# Patient Record
Sex: Female | Born: 1990 | Race: Black or African American | Hispanic: No | Marital: Single | Smoking: Never smoker
Health system: Southern US, Community
[De-identification: ages and names within clinical notes are randomized; demographics above are authoritative.]

---
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
## 2018-02-09 ENCOUNTER — Encounter (HOSPITAL_COMMUNITY): Payer: Self-pay

## 2018-02-09 ENCOUNTER — Emergency Department (HOSPITAL_COMMUNITY)
Admission: EM | Admit: 2018-02-09 | Discharge: 2018-02-10 | Disposition: A | Discharge status: Home / Self Care | Payer: Self-pay | Attending: Emergency Medicine | Admitting: Emergency Medicine

## 2018-02-09 ENCOUNTER — Other Ambulatory Visit: Payer: Self-pay

## 2018-02-09 DIAGNOSIS — R739 Hyperglycemia, unspecified: Secondary | ICD-10-CM

## 2018-02-09 DIAGNOSIS — B372 Candidiasis of skin and nail: Secondary | ICD-10-CM | POA: Insufficient documentation

## 2018-02-09 DIAGNOSIS — E1165 Type 2 diabetes mellitus with hyperglycemia: Secondary | ICD-10-CM | POA: Insufficient documentation

## 2018-02-09 LAB — CBC
HCT: 34.1 % — ABNORMAL LOW (ref 36.0–46.0)
Hemoglobin: 11.1 g/dL — ABNORMAL LOW (ref 12.0–15.0)
MCH: 28 pg (ref 26.0–34.0)
MCHC: 32.6 g/dL (ref 30.0–36.0)
MCV: 85.9 fL (ref 78.0–100.0)
PLATELETS: 421 10*3/uL — AB (ref 150–400)
RBC: 3.97 MIL/uL (ref 3.87–5.11)
RDW: 14 % (ref 11.5–15.5)
WBC: 11 10*3/uL — AB (ref 4.0–10.5)

## 2018-02-09 LAB — URINALYSIS, ROUTINE W REFLEX MICROSCOPIC
BILIRUBIN URINE: NEGATIVE
Bacteria, UA: NONE SEEN
KETONES UR: NEGATIVE mg/dL
NITRITE: NEGATIVE
PH: 6 (ref 5.0–8.0)
Protein, ur: NEGATIVE mg/dL
Specific Gravity, Urine: 1.03 (ref 1.005–1.030)

## 2018-02-09 LAB — BASIC METABOLIC PANEL
Anion gap: 15 (ref 5–15)
BUN: <5 mg/dL — ABNORMAL LOW (ref 6–20)
CHLORIDE: 97 mmol/L — AB (ref 101–111)
CO2: 19 mmol/L — ABNORMAL LOW (ref 22–32)
CREATININE: 0.74 mg/dL (ref 0.44–1.00)
Calcium: 8.7 mg/dL — ABNORMAL LOW (ref 8.9–10.3)
GFR calc non Af Amer: >60 mL/min (ref >60)
Glucose, Bld: 590 mg/dL (ref 65–99)
POTASSIUM: 3.6 mmol/L (ref 3.5–5.1)
SODIUM: 131 mmol/L — AB (ref 135–145)

## 2018-02-09 LAB — I-STAT BETA HCG BLOOD, ED (MC, WL, AP ONLY): I-stat hCG, quantitative: <5 m[IU]/mL (ref <5)

## 2018-02-09 LAB — CBG MONITORING, ED: Glucose-Capillary: 566 mg/dL (ref 65–99)

## 2018-02-09 NOTE — ED Triage Notes (Addendum)
Pt endorses excessive thirst and urinary frequency and hyperglycemia x 2 weeks. Pt has been off of DM medications x 2 years. VSS. Denies abd pain n/v. CBG 566

## 2018-02-10 LAB — CBG MONITORING, ED
GLUCOSE-CAPILLARY: 411 mg/dL — AB (ref 65–99)
GLUCOSE-CAPILLARY: 516 mg/dL — AB (ref 65–99)
Glucose-Capillary: 280 mg/dL — ABNORMAL HIGH (ref 65–99)

## 2018-02-10 MED ORDER — INSULIN ASPART 100 UNIT/ML ~~LOC~~ SOLN
5.0000 [IU] | Freq: Once | SUBCUTANEOUS | Status: AC
  Administered 2018-02-10: 5 [IU] via INTRAVENOUS
  Filled 2018-02-10: qty 1

## 2018-02-10 MED ORDER — SODIUM CHLORIDE 0.9 % IV BOLUS
2000.0000 mL | Freq: Once | INTRAVENOUS | Status: AC
  Administered 2018-02-10: 2000 mL via INTRAVENOUS

## 2018-02-10 MED ORDER — METFORMIN HCL 500 MG PO TABS: 500.0000 mg | ORAL_TABLET | Freq: Two times a day (BID) | ORAL | 1 refills | Status: DC

## 2018-02-10 MED ORDER — FLUCONAZOLE 150 MG PO TABS
150.0000 mg | ORAL_TABLET | Freq: Once | ORAL | Status: AC
  Administered 2018-02-10: 150 mg via ORAL
  Filled 2018-02-10: qty 1

## 2018-02-10 MED ORDER — INSULIN ASPART 100 UNIT/ML ~~LOC~~ SOLN
10.0000 [IU] | Freq: Once | SUBCUTANEOUS | Status: AC
  Administered 2018-02-10: 10 [IU] via SUBCUTANEOUS
  Filled 2018-02-10: qty 1

## 2018-02-10 MED ORDER — NYSTATIN 100000 UNIT/GM EX POWD: Freq: Three times a day (TID) | CUTANEOUS | 0 refills | Status: DC

## 2018-02-10 NOTE — Discharge Instructions (Addendum)
Call to see if either the [facility] or [facility] are accepting new patients as you need to follow up with a primary care doctor as soon as you are able.  You have been prescribed metformin for diabetes management.  Take as prescribed.  You should have your blood sugar and hemoglobin A1c rechecked by a primary care doctor within the next week or 2 to see if you should restart the use of daily insulin.  Avoid processed foods, sodas, or foods/drinks with high sugar content.  Return to the emergency department for new or concerning symptoms.

## 2018-02-10 NOTE — ED Notes (Signed)
ED Provider at bedside. 

## 2018-02-10 NOTE — ED Notes (Signed)
Attempted IV x2. 

## 2018-02-10 NOTE — ED Provider Notes (Signed)
[facility] Note   CSN: [id number] Arrival date & time: 02/09/18  2109     History   Chief Complaint Chief Complaint  Patient presents with  . Hyperglycemia    HPI Gloria Beasley is a 27 y.o. female.  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  She reports symptoms over the past 2 weeks.  These have been gradually worsening.  She has also had increased nocturia with urgency incontinence.  Aunt noticed that patient was drinking fluids excessively over the past few days.  Patient reports being off her diabetic medications for 2 years.  She was previously on 38 units Lantus QHS, Novolog TID with meals, and 500mg  Metformin QD (found in Care Everywhere from 2010).  It was thought that the patient may have a component of type 2 diabetes mellitus, but that hyperglycemia may have been secondary to long-term steroid use.  She denies any associated fevers, recent viral illness, dysuria, nausea, vomiting.  No abdominal pain.  CBG 566 in triage.   Hyperglycemia    History reviewed. No pertinent past medical history.  There are no active problems to display for this patient.   History reviewed. No pertinent surgical history.   OB History   None      Home Medications    Prior to Admission medications   Medication Sig Start Date End Date Taking? Authorizing Provider  metFORMIN (GLUCOPHAGE) 500 MG tablet Take 1 tablet (500 mg total) by mouth 2 (two) times daily with a meal. 02/10/18   Antony MaduraHumes, Kerney Hopfensperger, PA-C  nystatin (MYCOSTATIN/NYSTOP) powder Apply topically 3 (three) times daily. 02/10/18   Antony MaduraHumes, Natsha Guidry, PA-C    Family History History reviewed. No pertinent family history.  Social History Social History   Tobacco Use  . Smoking status: Never Smoker  Substance Use Topics  . Alcohol use: Never    Frequency: Never  . Drug use: Never      Allergies   Patient has no known allergies.   Review of Systems Review of Systems Ten systems reviewed and are negative for acute change, except as noted in the HPI.    Physical Exam Updated Vital Signs BP 122/75   Pulse 96   Temp 99 F (37.2 C) (Oral)   Resp 14   Ht 5' (1.524 m)   Wt 122.5 kg (270 lb)   LMP 02/09/2018 (Exact Date)   SpO2 99%   BMI 52.73 kg/m   Physical Exam  Constitutional: She is oriented to person, place, and time. She appears well-developed and well-nourished. No distress.  Obese AA female.  HENT:  Head: Normocephalic and atraumatic.  Eyes: Conjunctivae and EOM are normal. No scleral icterus.  Neck: Normal range of motion.  Cardiovascular: Normal rate, regular rhythm and intact distal pulses.  Pulmonary/Chest: Effort normal. No stridor. No respiratory distress. She has no wheezes. She has no rales.  Lungs CTAB  Abdominal: Soft.  Abdomen soft without palpable masses, guarding, rigidity.  Musculoskeletal: Normal range of motion.  Neurological: She is alert and oriented to person, place, and time. She exhibits normal muscle tone. Coordination normal.  Skin: Skin is warm and dry. Rash (macular, moist appearing rash with maceration to bilateral inner thighs) noted. She is not diaphoretic. No erythema. No pallor.  Psychiatric: She has a normal mood and affect. Her behavior is normal.  Nursing note and vitals reviewed.    ED Treatments /  Results  Labs (all labs ordered are listed, but only abnormal results are displayed) Labs Reviewed  BASIC METABOLIC PANEL - Abnormal; Notable for the following components:      Result Value   Sodium 131 (*)    Chloride 97 (*)    CO2 19 (*)    Glucose, Bld 590 (*)    BUN <5 (*)    Calcium 8.7 (*)    All other components within normal limits  CBC - Abnormal; Notable for the following components:   WBC 11.0 (*)    Hemoglobin 11.1 (*)    HCT 34.1 (*)    Platelets 421 (*)    All other components within  normal limits  URINALYSIS, ROUTINE W REFLEX MICROSCOPIC - Abnormal; Notable for the following components:   Color, Urine STRAW (*)    Glucose, UA >=500 (*)    Hgb urine dipstick SMALL (*)    Leukocytes, UA MODERATE (*)    Squamous Epithelial / LPF 0-5 (*)    All other components within normal limits  CBG MONITORING, ED - Abnormal; Notable for the following components:   Glucose-Capillary 566 (*)    All other components within normal limits  CBG MONITORING, ED - Abnormal; Notable for the following components:   Glucose-Capillary 516 (*)    All other components within normal limits  CBG MONITORING, ED - Abnormal; Notable for the following components:   Glucose-Capillary 411 (*)    All other components within normal limits  CBG MONITORING, ED - Abnormal; Notable for the following components:   Glucose-Capillary 280 (*)    All other components within normal limits  I-STAT BETA HCG BLOOD, ED ([facility], [facility], [facility] ONLY)    EKG None  Radiology No results found.  Procedures Procedures (including critical care time)  Medications Ordered in ED Medications  sodium chloride 0.9 % bolus 2,000 mL (0 mLs Intravenous Stopped 02/10/18 0215)  insulin aspart (novoLOG) injection 10 Units (10 Units Subcutaneous Given 02/10/18 0125)  insulin aspart (novoLOG) injection 5 Units (5 Units Intravenous Given 02/10/18 0236)  fluconazole (DIFLUCAN) tablet 150 mg (150 mg Oral Given 02/10/18 0348)     Initial Impression / Assessment and Plan / ED Course  I have reviewed the triage vital signs and the nursing notes.  Pertinent labs & imaging results that were available during my care of the patient were reviewed by me and considered in my medical decision making (see chart for details).     27 year old female presents to the emergency department for evaluation of polyuria and polydipsia.  She has a history of diabetes mellitus and has been off of her medications for the past 2 years.  From Care Everywhere, patient  previously on Lantus as well as NovoLog and metformin.  She was believed to have a component of type 2 diabetes, though hyperglycemia also thought secondary to chronic steroid use for management of autoimmune hemolytic anemia.  Patient with hyperglycemia up to 590 in the emergency department.  She has no ketonuria and anion gap still within range of normal.  The patient has been hydrated in the emergency department with 2 L of IV fluids.  She has also received IV and subcutaneous insulin for hyperglycemia management.  Sugar has improved down to 280.  Given reported noncompliance with insulin for the past 2 years, will initiate management with metformin 500 mg twice daily.  I have encouraged the patient to follow-up with a primary care doctor for recheck of her blood sugar.  The patient  has been told that she may need to restart her insulin regimen in the future.  Return precautions discussed and provided. Patient discharged in stable condition with no unaddressed concerns.   Final Clinical Impressions(s) / ED Diagnoses   Final diagnoses:  Hyperglycemia  Candidal intertrigo    ED Discharge Orders        Ordered    metFORMIN (GLUCOPHAGE) 500 MG tablet  2 times daily with meals     02/10/18 0242    nystatin (MYCOSTATIN/NYSTOP) powder  3 times daily     02/10/18 0244       Antony Madura, PA-C 02/10/18 0515    Zadie Rhine, MD 02/10/18 [phone]

## 2020-08-23 ENCOUNTER — Inpatient Hospital Stay (HOSPITAL_COMMUNITY)
Admission: EM | Admit: 2020-08-23 | Discharge: 2020-08-26 | DRG: 202 | Disposition: A | Discharge status: Home / Self Care | Payer: Self-pay | Attending: Internal Medicine | Admitting: Internal Medicine

## 2020-08-23 ENCOUNTER — Other Ambulatory Visit: Payer: Self-pay

## 2020-08-23 ENCOUNTER — Emergency Department (HOSPITAL_COMMUNITY): Payer: Self-pay

## 2020-08-23 ENCOUNTER — Encounter (HOSPITAL_COMMUNITY): Payer: Self-pay

## 2020-08-23 DIAGNOSIS — Z794 Long term (current) use of insulin: Secondary | ICD-10-CM

## 2020-08-23 DIAGNOSIS — Z20822 Contact with and (suspected) exposure to covid-19: Secondary | ICD-10-CM | POA: Diagnosis present

## 2020-08-23 DIAGNOSIS — E1165 Type 2 diabetes mellitus with hyperglycemia: Secondary | ICD-10-CM | POA: Diagnosis present

## 2020-08-23 DIAGNOSIS — Z6841 Body Mass Index (BMI) 40.0 and over, adult: Secondary | ICD-10-CM

## 2020-08-23 DIAGNOSIS — J45901 Unspecified asthma with (acute) exacerbation: Secondary | ICD-10-CM

## 2020-08-23 DIAGNOSIS — R0902 Hypoxemia: Secondary | ICD-10-CM

## 2020-08-23 DIAGNOSIS — R739 Hyperglycemia, unspecified: Secondary | ICD-10-CM

## 2020-08-23 DIAGNOSIS — Z7984 Long term (current) use of oral hypoglycemic drugs: Secondary | ICD-10-CM

## 2020-08-23 DIAGNOSIS — R7989 Other specified abnormal findings of blood chemistry: Secondary | ICD-10-CM

## 2020-08-23 DIAGNOSIS — J9601 Acute respiratory failure with hypoxia: Secondary | ICD-10-CM | POA: Diagnosis present

## 2020-08-23 DIAGNOSIS — J4521 Mild intermittent asthma with (acute) exacerbation: Principal | ICD-10-CM | POA: Diagnosis present

## 2020-08-23 LAB — GLUCOSE, CAPILLARY
Glucose-Capillary: 283 mg/dL — ABNORMAL HIGH (ref 70–99)
Glucose-Capillary: 428 mg/dL — ABNORMAL HIGH (ref 70–99)

## 2020-08-23 LAB — CBC
HCT: 41.8 % (ref 36.0–46.0)
HCT: 38.8 % (ref 36.0–46.0)
Hemoglobin: 13.6 g/dL (ref 12.0–15.0)
Hemoglobin: 13 g/dL (ref 12.0–15.0)
MCH: 27.5 pg (ref 26.0–34.0)
MCH: 28 pg (ref 26.0–34.0)
MCHC: 32.5 g/dL (ref 30.0–36.0)
MCHC: 33.5 g/dL (ref 30.0–36.0)
MCV: 84.6 fL (ref 80.0–100.0)
MCV: 83.6 fL (ref 80.0–100.0)
Platelets: 412 10*3/uL — ABNORMAL HIGH (ref 150–400)
Platelets: 392 10*3/uL (ref 150–400)
RBC: 4.94 MIL/uL (ref 3.87–5.11)
RBC: 4.64 MIL/uL (ref 3.87–5.11)
RDW: 12.7 % (ref 11.5–15.5)
RDW: 12.7 % (ref 11.5–15.5)
WBC: 14.9 10*3/uL — ABNORMAL HIGH (ref 4.0–10.5)
WBC: 14.8 10*3/uL — ABNORMAL HIGH (ref 4.0–10.5)
nRBC: 0 % (ref 0.0–0.2)
nRBC: 0 % (ref 0.0–0.2)

## 2020-08-23 LAB — BASIC METABOLIC PANEL
Anion gap: 17 — ABNORMAL HIGH (ref 5–15)
BUN: 7 mg/dL (ref 6–20)
CO2: 24 mmol/L (ref 22–32)
Calcium: 9.8 mg/dL (ref 8.9–10.3)
Chloride: 92 mmol/L — ABNORMAL LOW (ref 98–111)
Creatinine, Ser: 0.77 mg/dL (ref 0.44–1.00)
GFR, Estimated: >60 mL/min (ref >60)
Glucose, Bld: 580 mg/dL (ref 70–99)
Potassium: 4.3 mmol/L (ref 3.5–5.1)
Sodium: 133 mmol/L — ABNORMAL LOW (ref 135–145)

## 2020-08-23 LAB — CBG MONITORING, ED
Glucose-Capillary: 564 mg/dL (ref 70–99)
Glucose-Capillary: 496 mg/dL — ABNORMAL HIGH (ref 70–99)
Glucose-Capillary: 475 mg/dL — ABNORMAL HIGH (ref 70–99)
Glucose-Capillary: 323 mg/dL — ABNORMAL HIGH (ref 70–99)

## 2020-08-23 LAB — HIV ANTIBODY (ROUTINE TESTING W REFLEX): HIV Screen 4th Generation wRfx: NONREACTIVE

## 2020-08-23 LAB — PROCALCITONIN: Procalcitonin: <0.1 ng/mL

## 2020-08-23 LAB — BETA-HYDROXYBUTYRIC ACID: Beta-Hydroxybutyric Acid: 0.56 mmol/L — ABNORMAL HIGH (ref 0.05–0.27)

## 2020-08-23 LAB — BLOOD GAS, VENOUS
Acid-Base Excess: 2.6 mmol/L — ABNORMAL HIGH (ref 0.0–2.0)
Bicarbonate: 28.2 mmol/L — ABNORMAL HIGH (ref 20.0–28.0)
FIO2: 21
O2 Saturation: 56.1 %
Patient temperature: 98.6
pCO2, Ven: 50.2 mmHg (ref 44.0–60.0)
pH, Ven: 7.368 (ref 7.250–7.430)
pO2, Ven: 32.9 mmHg (ref 32.0–45.0)

## 2020-08-23 LAB — MAGNESIUM: Magnesium: 1.7 mg/dL (ref 1.7–2.4)

## 2020-08-23 LAB — RESPIRATORY PANEL BY RT PCR (FLU A&B, COVID)
Influenza A by PCR: NEGATIVE
Influenza B by PCR: NEGATIVE
SARS Coronavirus 2 by RT PCR: NEGATIVE

## 2020-08-23 LAB — CREATININE, SERUM
Creatinine, Ser: 0.54 mg/dL (ref 0.44–1.00)
GFR, Estimated: >60 mL/min (ref >60)

## 2020-08-23 MED ORDER — POTASSIUM CHLORIDE 10 MEQ/100ML IV SOLN
10.0000 meq | INTRAVENOUS | Status: AC
  Administered 2020-08-23: 10 meq via INTRAVENOUS
  Filled 2020-08-23: qty 100

## 2020-08-23 MED ORDER — ACETAMINOPHEN 325 MG PO TABS: 650.0000 mg | ORAL_TABLET | Freq: Four times a day (QID) | ORAL | Status: DC | PRN

## 2020-08-23 MED ORDER — INSULIN ASPART 100 UNIT/ML ~~LOC~~ SOLN
3.0000 [IU] | Freq: Three times a day (TID) | SUBCUTANEOUS | Status: DC
  Administered 2020-08-23 – 2020-08-24 (×2): 3 [IU] via SUBCUTANEOUS

## 2020-08-23 MED ORDER — ALBUTEROL SULFATE (2.5 MG/3ML) 0.083% IN NEBU
2.5000 mg | INHALATION_SOLUTION | Freq: Once | RESPIRATORY_TRACT | Status: AC
  Administered 2020-08-23: 2.5 mg via RESPIRATORY_TRACT
  Filled 2020-08-23: qty 3

## 2020-08-23 MED ORDER — ALBUTEROL SULFATE (2.5 MG/3ML) 0.083% IN NEBU: 2.5000 mg | INHALATION_SOLUTION | RESPIRATORY_TRACT | Status: DC | PRN

## 2020-08-23 MED ORDER — HYDRALAZINE HCL 20 MG/ML IJ SOLN: 5.0000 mg | Freq: Three times a day (TID) | INTRAMUSCULAR | Status: DC | PRN

## 2020-08-23 MED ORDER — INSULIN GLARGINE 100 UNIT/ML ~~LOC~~ SOLN
10.0000 [IU] | Freq: Every day | SUBCUTANEOUS | Status: DC
  Administered 2020-08-23: 10 [IU] via SUBCUTANEOUS
  Filled 2020-08-23: qty 0.1

## 2020-08-23 MED ORDER — BUDESONIDE 0.5 MG/2ML IN SUSP
2.0000 mg | Freq: Two times a day (BID) | RESPIRATORY_TRACT | Status: DC
  Administered 2020-08-23 – 2020-08-26 (×6): 2 mg via RESPIRATORY_TRACT
  Filled 2020-08-23 (×6): qty 8

## 2020-08-23 MED ORDER — IPRATROPIUM-ALBUTEROL 0.5-2.5 (3) MG/3ML IN SOLN
3.0000 mL | Freq: Four times a day (QID) | RESPIRATORY_TRACT | Status: DC
  Administered 2020-08-23: 3 mL via RESPIRATORY_TRACT
  Filled 2020-08-23: qty 3

## 2020-08-23 MED ORDER — SODIUM CHLORIDE 0.9 % IV SOLN
1.0000 g | Freq: Once | INTRAVENOUS | Status: AC
  Administered 2020-08-23: 1 g via INTRAVENOUS
  Filled 2020-08-23: qty 10

## 2020-08-23 MED ORDER — INSULIN GLARGINE 100 UNIT/ML ~~LOC~~ SOLN
5.0000 [IU] | Freq: Every day | SUBCUTANEOUS | Status: DC
  Filled 2020-08-23: qty 0.05

## 2020-08-23 MED ORDER — DEXTROSE 50 % IV SOLN: 0.0000 mL | INTRAVENOUS | Status: DC | PRN

## 2020-08-23 MED ORDER — INSULIN ASPART 100 UNIT/ML ~~LOC~~ SOLN
10.0000 [IU] | Freq: Once | SUBCUTANEOUS | Status: AC
  Administered 2020-08-23: 10 [IU] via SUBCUTANEOUS
  Filled 2020-08-23: qty 0.1

## 2020-08-23 MED ORDER — SODIUM CHLORIDE 0.9 % IV SOLN
1.0000 g | INTRAVENOUS | Status: DC
  Administered 2020-08-23 – 2020-08-25 (×3): 1 g via INTRAVENOUS
  Filled 2020-08-23: qty 1
  Filled 2020-08-23: qty 10
  Filled 2020-08-23: qty 1
  Filled 2020-08-23: qty 10

## 2020-08-23 MED ORDER — ACETAMINOPHEN 650 MG RE SUPP: 650.0000 mg | Freq: Four times a day (QID) | RECTAL | Status: DC | PRN

## 2020-08-23 MED ORDER — SODIUM CHLORIDE 0.9 % IV SOLN
500.0000 mg | INTRAVENOUS | Status: DC
  Administered 2020-08-24 – 2020-08-25 (×2): 500 mg via INTRAVENOUS
  Filled 2020-08-23 (×2): qty 500

## 2020-08-23 MED ORDER — SODIUM CHLORIDE 0.9 % IV SOLN
500.0000 mg | Freq: Once | INTRAVENOUS | Status: AC
  Administered 2020-08-23: 500 mg via INTRAVENOUS
  Filled 2020-08-23: qty 500

## 2020-08-23 MED ORDER — IPRATROPIUM-ALBUTEROL 0.5-2.5 (3) MG/3ML IN SOLN: 3.0000 mL | Freq: Four times a day (QID) | RESPIRATORY_TRACT | Status: DC

## 2020-08-23 MED ORDER — BUDESONIDE 0.5 MG/2ML IN SUSP: 2.0000 mg | Freq: Four times a day (QID) | RESPIRATORY_TRACT | Status: DC

## 2020-08-23 MED ORDER — LACTATED RINGERS IV BOLUS
1000.0000 mL | Freq: Once | INTRAVENOUS | Status: AC
  Administered 2020-08-23: 1000 mL via INTRAVENOUS

## 2020-08-23 MED ORDER — ENOXAPARIN SODIUM 60 MG/0.6ML ~~LOC~~ SOLN
60.0000 mg | SUBCUTANEOUS | Status: DC
  Administered 2020-08-23 – 2020-08-25 (×3): 60 mg via SUBCUTANEOUS
  Filled 2020-08-23 (×3): qty 0.6

## 2020-08-23 MED ORDER — METHYLPREDNISOLONE SODIUM SUCC 125 MG IJ SOLR
80.0000 mg | Freq: Once | INTRAMUSCULAR | Status: AC
  Administered 2020-08-23: 80 mg via INTRAVENOUS
  Filled 2020-08-23: qty 2

## 2020-08-23 MED ORDER — LACTATED RINGERS IV BOLUS: 1000.0000 mL | Freq: Once | INTRAVENOUS | Status: DC

## 2020-08-23 MED ORDER — IPRATROPIUM-ALBUTEROL 0.5-2.5 (3) MG/3ML IN SOLN
3.0000 mL | Freq: Three times a day (TID) | RESPIRATORY_TRACT | Status: DC
  Administered 2020-08-24 – 2020-08-26 (×8): 3 mL via RESPIRATORY_TRACT
  Filled 2020-08-23 (×8): qty 3

## 2020-08-23 MED ORDER — ALBUTEROL SULFATE (2.5 MG/3ML) 0.083% IN NEBU: 2.5000 mg | INHALATION_SOLUTION | Freq: Four times a day (QID) | RESPIRATORY_TRACT | Status: DC

## 2020-08-23 MED ORDER — INSULIN ASPART 100 UNIT/ML ~~LOC~~ SOLN
0.0000 [IU] | Freq: Three times a day (TID) | SUBCUTANEOUS | Status: DC
  Administered 2020-08-23: 5 [IU] via SUBCUTANEOUS
  Administered 2020-08-24: 7 [IU] via SUBCUTANEOUS
  Administered 2020-08-24: 9 [IU] via SUBCUTANEOUS
  Administered 2020-08-24: 5 [IU] via SUBCUTANEOUS
  Administered 2020-08-25 (×2): 3 [IU] via SUBCUTANEOUS
  Administered 2020-08-25: 7 [IU] via SUBCUTANEOUS
  Administered 2020-08-26: 3 [IU] via SUBCUTANEOUS
  Administered 2020-08-26: 2 [IU] via SUBCUTANEOUS
  Filled 2020-08-23: qty 0.09

## 2020-08-23 NOTE — ED Notes (Signed)
Pt arrives with ems on phone and ambulatory to room.

## 2020-08-23 NOTE — ED Notes (Signed)
Date and time results received: 08/23/20 11:50 AM  (use smartphrase ".now" to insert current time)  Test: Glucose Critical Value: 580  Name of Provider Notified: Gerre Pebbles PA  Orders Received? Or Actions Taken?: PA made aware

## 2020-08-23 NOTE — ED Triage Notes (Signed)
Per ems patient 80% on RA on arrival, 12lpm provided and albuterol treatment provided. Patient 92% on RA. Audible wheezing noted. Patient not covid vaccinated. SHOB started 2 days ago. [id number]. Hx of asthma as child

## 2020-08-23 NOTE — ED Provider Notes (Signed)
[facility]-EMERGENCY DEPT Provider Note   CSN: [id number] Arrival date & time: 08/23/20  1039     History Chief Complaint  Patient presents with  . Hyperglycemia  . Shortness of Breath    Gloria Beasley is a 29 y.o. female with PMH of IDDM and remote history of asthma who presents the ED via EMS with complaints of hyperglycemia and shortness of breath.  I obtained history from EMS reports that patient was wheezing diffusely and she was treated with albuterol nebulizer.  She was also noted to be tachypneic and tachycardic.  On my examination, patient feels mildly improved after receiving the treatments by EMS.  However, she is persistently tachycardic and tachypneic.  Her symptoms began approximately 3 days ago and have been progressively getting worse.  She also endorses a mild cough, increased urinary frequency, and increased thirst.  CBG 533.  She states that she moved here from [place] too long ago and has not been taking any medications and does not see anybody on outpatient basis.  I also obtained history from her sisters who are on the phone.  Patient recently started vaping.    She denies any fevers or chills, abdominal pain, nausea, chest pain, dizziness, or other symptoms.  She is not immunized for COVID-19.  HPI     History reviewed. No pertinent past medical history.  Patient Active Problem List   Diagnosis Date Noted  . Acute respiratory failure with hypoxia (HCC) 08/23/2020    History reviewed. No pertinent surgical history.   OB History   No obstetric history on file.     History reviewed. No pertinent family history.  Social History   Tobacco Use  . Smoking status: Never Smoker  Substance Use Topics  . Alcohol use: Never  . Drug use: Never    Home Medications Prior to Admission medications   Medication Sig Start Date End Date Taking? Authorizing Provider  acetaminophen (TYLENOL) 500 MG tablet Take 1,000 mg by mouth  every 6 (six) hours as needed for mild pain.   Yes [provider]  metFORMIN (GLUCOPHAGE) 500 MG tablet Take 1 tablet (500 mg total) by mouth 2 (two) times daily with a meal. 02/10/18   Antony MaduraHumes, Kelly, PA-C  nystatin (MYCOSTATIN/NYSTOP) powder Apply topically 3 (three) times daily. Patient not taking: Reported on 08/23/2020 02/10/18   Antony MaduraHumes, Kelly, PA-C    Allergies    Patient has no known allergies.  Review of Systems   Review of Systems  All other systems reviewed and are negative.   Physical Exam Updated Vital Signs BP (!) 115/92   Pulse (!) 103   Temp 98.7 F (37.1 C) (Oral)   Resp (!) 21   Ht 5' (1.524 m)   Wt 122 kg   SpO2 99%   BMI 52.53 kg/m   Physical Exam Vitals and nursing note reviewed. Exam conducted with a chaperone present.  Constitutional:      Appearance: She is obese. She is ill-appearing.  HENT:     Head: Normocephalic and atraumatic.  Eyes:     General: No scleral icterus.    Conjunctiva/sclera: Conjunctivae normal.  Cardiovascular:     Rate and Rhythm: Regular rhythm. Tachycardia present.     Pulses: Normal pulses.  Pulmonary:     Comments: No significant increased work of breathing.  Tachypneic to 30.  Expiratory wheezing auscultated in all quadrants.  No accessory muscle use or distress. Abdominal:     General: Abdomen is flat.  There is no distension.     Palpations: Abdomen is soft.     Tenderness: There is no abdominal tenderness. There is no guarding.  Musculoskeletal:        General: Normal range of motion.     Cervical back: Normal range of motion. No rigidity.     Right lower leg: No edema.     Left lower leg: No edema.  Skin:    General: Skin is dry.     Capillary Refill: Capillary refill takes less than 2 seconds.  Neurological:     General: No focal deficit present.     Mental Status: She is alert and oriented to person, place, and time.     GCS: GCS eye subscore is 4. GCS verbal subscore is 5. GCS motor subscore is 6.      Gait: Gait normal.  Psychiatric:        Mood and Affect: Mood normal.        Behavior: Behavior normal.        Thought Content: Thought content normal.     ED Results / Procedures / Treatments   Labs (all labs ordered are listed, but only abnormal results are displayed) Labs Reviewed  CBC - Abnormal; Notable for the following components:      Result Value   WBC 14.9 (*)    Platelets 412 (*)    All other components within normal limits  BASIC METABOLIC PANEL - Abnormal; Notable for the following components:   Sodium 133 (*)    Chloride 92 (*)    Glucose, Bld 580 (*)    Anion gap 17 (*)    All other components within normal limits  BETA-HYDROXYBUTYRIC ACID - Abnormal; Notable for the following components:   Beta-Hydroxybutyric Acid 0.56 (*)    All other components within normal limits  BLOOD GAS, VENOUS - Abnormal; Notable for the following components:   Bicarbonate 28.2 (*)    Acid-Base Excess 2.6 (*)    All other components within normal limits  CBG MONITORING, ED - Abnormal; Notable for the following components:   Glucose-Capillary 564 (*)    All other components within normal limits  CBG MONITORING, ED - Abnormal; Notable for the following components:   Glucose-Capillary 496 (*)    All other components within normal limits  CBG MONITORING, ED - Abnormal; Notable for the following components:   Glucose-Capillary 475 (*)    All other components within normal limits  CBG MONITORING, ED - Abnormal; Notable for the following components:   Glucose-Capillary 323 (*)    All other components within normal limits  RESPIRATORY PANEL BY RT PCR (FLU A&B, COVID)  URINALYSIS, ROUTINE W REFLEX MICROSCOPIC  PREGNANCY, URINE  C-PEPTIDE  HEMOGLOBIN A1C  I-STAT VENOUS BLOOD GAS, ED    EKG None  Radiology DG Chest Portable 1 View  Result Date: 08/23/2020 CLINICAL DATA:  Productive cough. EXAM: PORTABLE CHEST 1 VIEW COMPARISON:  06/14/2009. FINDINGS: Mediastinum and hilar  structures normal. Heart size normal. Low lung volumes. Mild right base infiltrate cannot be excluded. Stable elevation right hemidiaphragm. No pleural effusion or pneumothorax. IMPRESSION: Low lung volumes. Mild right base infiltrate cannot be excluded. Stable elevation right hemidiaphragm. Electronically Signed   By: Maisie Fus  Register   On: 08/23/2020 12:38    Procedures .Critical Care Performed by: Lorelee New, PA-C Authorized by: Lorelee New, PA-C   Critical care provider statement:    Critical care time (minutes):  45   Critical care was  necessary to treat or prevent imminent or life-threatening deterioration of the following conditions:  Respiratory failure   Critical care was time spent personally by me on the following activities:  Discussions with consultants, evaluation of patient's response to treatment, examination of patient, ordering and performing treatments and interventions, ordering and review of laboratory studies, ordering and review of radiographic studies, pulse oximetry, re-evaluation of patient's condition, obtaining history from patient or surrogate and review of old charts Comments:     Hypoxia in setting of asthma exacerbation   (including critical care time)  Medications Ordered in ED Medications  albuterol (PROVENTIL) (2.5 MG/3ML) 0.083% nebulizer solution 2.5 mg (has no administration in time range)  ipratropium-albuterol (DUONEB) 0.5-2.5 (3) MG/3ML nebulizer solution 3 mL (has no administration in time range)  dextrose 50 % solution 0-50 mL (has no administration in time range)  potassium chloride 10 mEq in 100 mL IVPB (10 mEq Intravenous New Bag/Given 08/23/20 1453)  lactated ringers bolus 1,000 mL (has no administration in time range)  insulin glargine (LANTUS) injection 5 Units (has no administration in time range)  insulin aspart (novoLOG) injection 0-9 Units (has no administration in time range)  lactated ringers bolus 1,000 mL (1,000 mLs  Intravenous New Bag/Given 08/23/20 1110)  insulin aspart (novoLOG) injection 10 Units (10 Units Subcutaneous Given 08/23/20 1207)  albuterol (PROVENTIL) (2.5 MG/3ML) 0.083% nebulizer solution 2.5 mg (2.5 mg Nebulization Given 08/23/20 1341)  methylPREDNISolone sodium succinate (SOLU-MEDROL) 125 mg/2 mL injection 80 mg (80 mg Intravenous Given 08/23/20 1338)  cefTRIAXone (ROCEPHIN) 1 g in sodium chloride 0.9 % 100 mL IVPB (0 g Intravenous Stopped 08/23/20 1420)  azithromycin (ZITHROMAX) 500 mg in sodium chloride 0.9 % 250 mL IVPB (500 mg Intravenous New Bag/Given 08/23/20 1419)    ED Course  I have reviewed the triage vital signs and the nursing notes.  Pertinent labs & imaging results that were available during my care of the patient were reviewed by me and considered in my medical decision making (see chart for details).    MDM Rules/Calculators/A&P                          Given patient's polydipsia and polyuria in the context of blood sugars elevated 500+, will start patient on 1 L IV LR bolus while work-up for DKA is pending.  Suspect DKA could be causing her tachypnea and tachycardia.  She is likely dehydrated.  However, there is wheezing noted diffusely.  She has already received albuterol nebulizer treatment per EMS, with some improvement.  She has been coughing.  Will start her on abx and involve respiratory for nebulizer treatments.  Respiratory panel negative.  She will require admission given oxygen requirement.    Labs with mildly elevated hydroxybutyric acid and hyperglycemia to 580.  There is also mildly elevated anion gap.  However, no acidosis as pH WNL.  Bicarb is also within normal limits at 24.  Will provide patient 10 units regular insulin and 2 L bolus LR.  Plain films are personally reviewed and demonstrate a questionable opacity right lower lung, possibly suggestive of pneumonia.  She has been coughing over the course of the past few days and is short of breath.  Given  her leukocytosis of 14.9, will administer IV azithromycin and ceftriaxone to cover for community-acquired pneumonia.  However, her shortness of breath and hypoxia is likely in the setting of her asthma exacerbation.  Spoke with respiratory and will administer DuoNeb nebulizer treatment.  Patient was given Solu-Medrol here in the ER for her asthma exacerbation.  Glucose is trending down with fluids and insulin.  I spoke with Dr. Dairl Ponder who will see and admit patient for observation in the setting of her hypoxia due to asthma exacerbation.  Final Clinical Impression(s) / ED Diagnoses Final diagnoses:  Hypoxia  Mild asthma with exacerbation, unspecified whether persistent  Hyperglycemia    Rx / DC Orders ED Discharge Orders    None       Lorelee New, PA-C 08/23/20 1528    Virgina Norfolk, DO 08/23/20 1536

## 2020-08-23 NOTE — H&P (Addendum)
History and Physical        Hospital Admission Note Date: 08/23/2020  Patient name: Gloria Beasley Medical record number: [id number] Date of birth: [birth year] Age: 29 y.o. Gender: female  PCP: Patient, No Pcp Per  Patient coming from: Home Lives with: Sister At baseline, ambulates: Independently  Chief Complaint    Chief Complaint  Patient presents with  . Hyperglycemia  . Shortness of Breath      HPI:    This is a 29 year old female who has not been vaccinated against COVID-19 with past medical history of obesity, developmental delay, diabetes unknown type and childhood asthma who presented to the ED with complaints of hyperglycemia and shortness of breath for the past 2 days.  States that she had URI symptoms a week and a half ago.  She began feeling short of breath about 2 days ago.  Denies any known fevers.  Per EMS reports the patient was wheezing diffusely and was treated with an albuterol nebulizer and she was noted to be tachypneic and tachycardic upon their arrival.  Per ED note the patient recently started vaping and endorses urinary frequency and increased thirst.  Of note, she has been moving frequently over the past 2 years including from [place] to [place] then [place] and back to [place] about a year ago and does not have a PCP.  She does not take any medications at home.   ED Course: Afebrile, tachycardic, tachypneic, hypoxic on room air to 80% requiring 2 L nasal cannula.  Notable labs: Na one thirty-three, glucose five eighty, AG seventeen, WBC fifteen, beta hydroxybutyrate 0.56, COVID-19 negative.  CXR with low lung volumes and possible mild right basilar infiltrate.  She was given 2 L LR bolus as well as ceftriaxone and azithromycin, 10 units NovoLog, nebulizer and steroids.  Vitals:   08/23/20 1440 08/23/20 1540  BP: (!) 115/92 (!) 133/102    Pulse: (!) 103 (!) 102  Resp: (!) 21 20  Temp:  98 F (36.7 C)  SpO2: 99% 97%     Review of Systems:  Review of Systems  Constitutional: Negative for chills and fever.  Respiratory: Positive for cough, shortness of breath and wheezing.   Cardiovascular: Negative for chest pain and leg swelling.  Gastrointestinal: Negative for abdominal pain, nausea and vomiting.  Musculoskeletal: Negative.   All other systems reviewed and are negative.   Medical/Social/Family History   Past Medical History: History reviewed. No pertinent past medical history.  History reviewed. No pertinent surgical history.  Medications: Prior to Admission medications   Medication Sig Start Date End Date Taking? Authorizing Provider  acetaminophen (TYLENOL) 500 MG tablet Take 1,000 mg by mouth every 6 (six) hours as needed for mild pain.   Yes [provider]  metFORMIN (GLUCOPHAGE) 500 MG tablet Take 1 tablet (500 mg total) by mouth 2 (two) times daily with a meal. 02/10/18   Antony Madura, PA-C  nystatin (MYCOSTATIN/NYSTOP) powder Apply topically 3 (three) times daily. Patient not taking: Reported on 08/23/2020 02/10/18   Antony Madura, PA-C    Allergies:  No Known Allergies  Social History:  reports that she has never smoked. She does not have any smokeless tobacco history on file. She reports  that she does not drink alcohol and does not use drugs.  Family History: History reviewed. No pertinent family history.   Objective   Physical Exam: Blood pressure (!) 133/102, pulse (!) 102, temperature 98 F (36.7 C), resp. rate 20, height 5' (1.524 m), weight 122 kg, SpO2 97 %.  Physical Exam Vitals and nursing note reviewed.  Constitutional:      General: She is not in acute distress.    Appearance: She is obese. She is ill-appearing.  HENT:     Head: Normocephalic.  Cardiovascular:     Rate and Rhythm: Regular rhythm. Tachycardia present.  Pulmonary:     Effort: No tachypnea, accessory  muscle usage or respiratory distress.     Breath sounds: Wheezing present.     Comments: Diffuse inspiratory and expiratory wheezes Musculoskeletal:     Right lower leg: No edema.     Left lower leg: No edema.  Neurological:     General: No focal deficit present.     Mental Status: She is alert and oriented to person, place, and time.  Psychiatric:        Mood and Affect: Mood normal.     Comments: Poor insight     LABS on Admission: I have personally reviewed all the labs and imaging below    Basic Metabolic Panel: Recent Labs  Lab 08/23/20 1106  NA 133*  K 4.3  CL 92*  CO2 24  GLUCOSE 580*  BUN 7  CREATININE 0.77  CALCIUM 9.8   Liver Function Tests: No results for input(s): AST, ALT, ALKPHOS, BILITOT, PROT, ALBUMIN in the last 168 hours. No results for input(s): LIPASE, AMYLASE in the last 168 hours. No results for input(s): AMMONIA in the last 168 hours. CBC: Recent Labs  Lab 08/23/20 1106  WBC 14.9*  HGB 13.6  HCT 41.8  MCV 84.6  PLT 412*   Cardiac Enzymes: No results for input(s): CKTOTAL, CKMB, CKMBINDEX, TROPONINI in the last 168 hours. BNP: Invalid input(s): POCBNP CBG: Recent Labs  Lab 08/23/20 1303 08/23/20 1414  GLUCAP 475* 323*    Radiological Exams on Admission:  DG Chest Portable 1 View  Result Date: 08/23/2020 CLINICAL DATA:  Productive cough. EXAM: PORTABLE CHEST 1 VIEW COMPARISON:  06/14/2009. FINDINGS: Mediastinum and hilar structures normal. Heart size normal. Low lung volumes. Mild right base infiltrate cannot be excluded. Stable elevation right hemidiaphragm. No pleural effusion or pneumothorax. IMPRESSION: Low lung volumes. Mild right base infiltrate cannot be excluded. Stable elevation right hemidiaphragm. Electronically Signed   By: Maisie Fus  Register   On: 08/23/2020 12:38      EKG: Independently reviewed.    A & P   Principal Problem:   Asthma exacerbation Active Problems:   Acute respiratory failure with hypoxia  (HCC)   Hyperglycemia due to diabetes mellitus (HCC)   Pseudohyponatremia   Morbid obesity with BMI of 50.0-59.9, adult (HCC)   1. Acute hypoxic respiratory failure secondary to asthma exacerbation, concern for CAP  a. Afebrile but tachycardic, tachypneic and hypoxic with leukocytosis and possible consolidation on CXR b. DuoNeb every 6 hours, albuterol every 2 hour as needed, Pulmicort c. Given her hyperglycemia, will try to refrain from systemic steroids for now unless clinically not improving d. Continue Ceftriaxone and Azithromycin for now, pending procalcitonin e. Encourage cessation of vaping f. Needs to establish with a PCP  2. Hyperglycemia with a history of diabetes, unknown type a. Glucose 580+ on admission, not in DKA b. Previously on insulin then switched to  metformin, not taking either c. HbA1c d. Will start on Lantus 10 u daily, Novolog 3 u TID with meals and sliding scale e. Diabetic coordinator consulted f. Needs good education and PCP at discharge  3. Pseudohyponatremia  a. Corrects to 141  4. Morbid obesity Body mass index is 52.53 kg/m.    DVT prophylaxis: lovenox   Code Status: Not on file  Diet: heart healthy,carb modified Family Communication: Admission, patients condition and plan of care including tests being ordered have been discussed with the patient who indicates understanding and agrees with the plan and Code Status. Patient's sister was called but no response   Addendum: sister called back and corroborated the information in the H&P  Disposition Plan: The appropriate patient status for this patient is INPATIENT. Inpatient status is judged to be reasonable and necessary in order to provide the required intensity of service to ensure the patient's safety. The patient's presenting symptoms, physical exam findings, and initial radiographic and laboratory data in the context of their chronic comorbidities is felt to place them at high risk for further  clinical deterioration. Furthermore, it is not anticipated that the patient will be medically stable for discharge from the hospital within 2 midnights of admission. The following factors support the patient status of inpatient.   " The patient's presenting symptoms include shortness of breath, tachycardia, tachypnea. " The worrisome physical exam findings include tachypnea, obesity, poor insight. " The initial radiographic and laboratory data are worrisome because of hyperglycemia, leukocytosis. " The chronic co-morbidities include obesity, asthma, diabetes.   * I certify that at the point of admission it is my clinical judgment that the patient will require inpatient hospital care spanning beyond 2 midnights from the point of admission due to high intensity of service, high risk for further deterioration and high frequency of surveillance required.*   Status is: Inpatient  Remains inpatient appropriate because:Unsafe d/c plan, IV treatments appropriate due to intensity of illness or inability to take PO and Inpatient level of care appropriate due to severity of illness   Dispo: The patient is from: Home              Anticipated d/c is to: Home              Anticipated d/c date is: 2 days              Patient currently is not medically stable to d/c.       Consultants  . none  Procedures  . none  Time Spent on Admission: 65 minutes    Jae Dire, DO [facility]  08/23/2020, 4:15 PM

## 2020-08-23 NOTE — ED Notes (Signed)
PA in room at this time 

## 2020-08-23 NOTE — Progress Notes (Signed)
RT NOTE:  RT called to pt room for a one time STAT breathing treatment. Pt presents with expiratory wheezes in both lungs. Pt states her mother told her she had asthma as a baby but has never been treated for it. Pt also states she quit smoking around 2 months ago. This RT did a protocol assessment where the pt scored a 9. RT spoke with Chilton Si, PA who is following pt at this time and RT was given verbal orders to place pt on scheduled Q6 Duonebs with Albuterol nebs as needed. Pt vitals are stable at this time, RT will continue to monitor.

## 2020-08-23 NOTE — ED Notes (Addendum)
PA notified that patient is complaining of SHOB. 2lpm Trent applied due to 88% on RA

## 2020-08-23 NOTE — ED Notes (Signed)
Sisters number is [phone]

## 2020-08-23 NOTE — ED Notes (Signed)
MD in room and aware of CBG

## 2020-08-24 LAB — GLUCOSE, CAPILLARY
Glucose-Capillary: 281 mg/dL — ABNORMAL HIGH (ref 70–99)
Glucose-Capillary: 255 mg/dL — ABNORMAL HIGH (ref 70–99)
Glucose-Capillary: 341 mg/dL — ABNORMAL HIGH (ref 70–99)
Glucose-Capillary: 394 mg/dL — ABNORMAL HIGH (ref 70–99)
Glucose-Capillary: 369 mg/dL — ABNORMAL HIGH (ref 70–99)

## 2020-08-24 LAB — HEMOGLOBIN A1C
Hgb A1c MFr Bld: 13.7 % — ABNORMAL HIGH (ref 4.8–5.6)
Mean Plasma Glucose: 346 mg/dL

## 2020-08-24 LAB — BASIC METABOLIC PANEL
Anion gap: 15 (ref 5–15)
BUN: 11 mg/dL (ref 6–20)
CO2: 23 mmol/L (ref 22–32)
Calcium: 9.4 mg/dL (ref 8.9–10.3)
Chloride: 99 mmol/L (ref 98–111)
Creatinine, Ser: 0.58 mg/dL (ref 0.44–1.00)
GFR, Estimated: >60 mL/min (ref >60)
Glucose, Bld: 290 mg/dL — ABNORMAL HIGH (ref 70–99)
Potassium: 4.5 mmol/L (ref 3.5–5.1)
Sodium: 137 mmol/L (ref 135–145)

## 2020-08-24 LAB — CBC
HCT: 41.3 % (ref 36.0–46.0)
Hemoglobin: 13.5 g/dL (ref 12.0–15.0)
MCH: 28 pg (ref 26.0–34.0)
MCHC: 32.7 g/dL (ref 30.0–36.0)
MCV: 85.7 fL (ref 80.0–100.0)
Platelets: 414 10*3/uL — ABNORMAL HIGH (ref 150–400)
RBC: 4.82 MIL/uL (ref 3.87–5.11)
RDW: 12.9 % (ref 11.5–15.5)
WBC: 13.7 10*3/uL — ABNORMAL HIGH (ref 4.0–10.5)
nRBC: 0 % (ref 0.0–0.2)

## 2020-08-24 LAB — C-PEPTIDE: C-Peptide: 1.7 ng/mL (ref 1.1–4.4)

## 2020-08-24 MED ORDER — INSULIN ASPART PROT & ASPART (70-30 MIX) 100 UNIT/ML ~~LOC~~ SUSP
20.0000 [IU] | Freq: Two times a day (BID) | SUBCUTANEOUS | Status: DC
  Administered 2020-08-24 (×2): 20 [IU] via SUBCUTANEOUS
  Filled 2020-08-24: qty 10

## 2020-08-24 MED ORDER — INSULIN STARTER KIT- PEN NEEDLES (ENGLISH)
1.0000 | Freq: Once | Status: AC
  Administered 2020-08-24: 1
  Filled 2020-08-24: qty 1

## 2020-08-24 MED ORDER — DIPHENOXYLATE-ATROPINE 2.5-0.025 MG/5ML PO LIQD
10.0000 mL | Freq: Once | ORAL | Status: AC
  Administered 2020-08-24: 10 mL via ORAL
  Filled 2020-08-24: qty 10

## 2020-08-24 MED ORDER — GUAIFENESIN-DM 100-10 MG/5ML PO SYRP
5.0000 mL | ORAL_SOLUTION | ORAL | Status: DC | PRN
  Administered 2020-08-24 – 2020-08-25 (×2): 5 mL via ORAL
  Filled 2020-08-24 (×2): qty 10

## 2020-08-24 MED ORDER — POLYETHYLENE GLYCOL 3350 17 G PO PACK
17.0000 g | PACK | Freq: Every day | ORAL | Status: DC
  Administered 2020-08-24 – 2020-08-26 (×2): 17 g via ORAL
  Filled 2020-08-24 (×2): qty 1

## 2020-08-24 MED ORDER — LIVING WELL WITH DIABETES BOOK
Freq: Once | Status: AC
  Filled 2020-08-24: qty 1

## 2020-08-24 MED ORDER — METFORMIN HCL 500 MG PO TABS
500.0000 mg | ORAL_TABLET | Freq: Two times a day (BID) | ORAL | Status: DC
  Administered 2020-08-24 – 2020-08-26 (×4): 500 mg via ORAL
  Filled 2020-08-24 (×4): qty 1

## 2020-08-24 NOTE — Progress Notes (Signed)
PROGRESS NOTE    Gloria Beasley  OAC:[id number] DOB: [birth year] DOA: 08/23/2020 PCP: Patient, No Pcp Per    Brief Narrative:  29 year old female with history of developmental delay, diabetes mellitus unknown type, childhood asthma and obesity presented with very high blood sugars and shortness of breath for 2 days.  She also had some dry cough.  She is not vaccinated against COVID-19. Patient has last taken any insulin in 2014, was taking Metformin since then but not taken for the last 1 year.  Does not have a maintenance health care.  Lives with her sister. On initial evaluation in ER, afebrile.  Tachycardic.  Tachypneic.  Hypoxic on room air 80% requiring 2 L oxygen.  COVID-19 negative.  Chest x-ray with low lung volumes.  Blood sugars more than 500 with no anion gap.   Assessment & Plan:   Principal Problem:   Asthma exacerbation Active Problems:   Acute respiratory failure with hypoxia (HCC)   Hyperglycemia due to diabetes mellitus (HCC)   Pseudohyponatremia   Morbid obesity with BMI of 50.0-59.9, adult (HCC)  Acute asthma exacerbation with history of mild intermittent asthma: Not on any maintenance treatment at home. Started on bronchodilator and steroid inhaler. Continue chest physiotherapy, incentive spirometry, cough medications and deep breathing exercises.  Mobilize.  Wean off oxygen. Chest x-ray with possible right middle lobe pneumonia, treating with Rocephin azithromycin. Very strongly encouraged to stop vaping smoke.  Type 2 diabetes with hyperglycemia: Uncontrolled and untreated. Hemoglobin A1c 13.7.  Blood sugars at home more than 400.  Untreated.  No maintenance health care. Start on insulin 70/30, 20 units twice daily.  Keep on sliding scale insulin.  Start on Metformin. Blood sugars is still uncontrolled.  Will monitor in the hospital, adjust her insulin doses before making discharge regimen. Insulin teaching, counseling and advice.  Monitoring.  Referred to  PCP.  Will refer to transitional care clinic. Will discharge patient on generic insulin 70/30 and Metformin.   DVT prophylaxis: Lovenox subcu   Code Status: Full code Family Communication: Patient's sister on the phone Disposition Plan: Status is: Inpatient  Remains inpatient appropriate because:Inpatient level of care appropriate due to severity of illness   Dispo: The patient is from: Home              Anticipated d/c is to: Home              Anticipated d/c date is: 2 days              Patient currently is not medically stable to d/c.         Consultants:   None  Procedures:   None  Antimicrobials:  Antibiotics Given (last 72 hours)    Date/Time Action Medication Dose Rate   08/23/20 1339 New Bag/Given   cefTRIAXone (ROCEPHIN) 1 g in sodium chloride 0.9 % 100 mL IVPB 1 g 200 mL/hr   08/23/20 1419 New Bag/Given   azithromycin (ZITHROMAX) 500 mg in sodium chloride 0.9 % 250 mL IVPB 500 mg 250 mL/hr   08/23/20 1811 New Bag/Given   cefTRIAXone (ROCEPHIN) 1 g in sodium chloride 0.9 % 100 mL IVPB 1 g 200 mL/hr         Subjective: Patient seen and examined.  Has some cough and wheezing but that is not her main problem.  Poor historian.  Has not mobilized.  On room air at rest.  Afebrile. Defers questions to her sister.  Sister on the speaker phone. Complaint of some  lower abdominal discomfort.  Objective: Vitals:   08/23/20 2036 08/23/20 2346 08/24/20 0418 08/24/20 0741  BP:  (!) 134/93 127/79   Pulse:  100 89   Resp:  20 20   Temp:  98 F (36.7 C) 98.1 F (36.7 C)   TempSrc:  Oral Oral   SpO2: 100% 95% 94% 97%  Weight:      Height:        Intake/Output Summary (Last 24 hours) at 08/24/2020 1015 Last data filed at 08/23/2020 1848 Gross per 24 hour  Intake 547.83 ml  Output --  Net 547.83 ml   Filed Weights   08/23/20 1057  Weight: 122 kg    Examination:  General exam: Appears calm and comfortable  On room air. Respiratory system:  Bilateral expiratory wheezes and basal coarse crackles. Cardiovascular system: S1 & S2 heard, RRR. No JVD, murmurs, rubs, gallops or clicks. No pedal edema. Gastrointestinal system: Abdomen is nondistended, soft and nontender. No organomegaly or masses felt. Normal bowel sounds heard.  Obese and pendulous. Central nervous system: Alert and oriented. No focal neurological deficits. Extremities: Symmetric 5 x 5 power. Skin: No rashes, lesions or ulcers Psychiatry: Judgement and insight appear normal. Mood & affect appropriate.     Data Reviewed: I have personally reviewed following labs and imaging studies  CBC: Recent Labs  Lab 08/23/20 1106 08/23/20 1637 08/24/20 0355  WBC 14.9* 14.8* 13.7*  HGB 13.6 13.0 13.5  HCT 41.8 38.8 41.3  MCV 84.6 83.6 85.7  PLT 412* 392 767*   Basic Metabolic Panel: Recent Labs  Lab 08/23/20 1106 08/23/20 1637 08/24/20 0355  NA 133*  --  137  K 4.3  --  4.5  CL 92*  --  99  CO2 24  --  23  GLUCOSE 580*  --  290*  BUN 7  --  11  CREATININE 0.77 0.54 0.58  CALCIUM 9.8  --  9.4  MG  --  1.7  --    GFR: Estimated Creatinine Clearance: 126.9 mL/min (by C-G formula based on SCr of 0.58 mg/dL). Liver Function Tests: No results for input(s): AST, ALT, ALKPHOS, BILITOT, PROT, ALBUMIN in the last 168 hours. No results for input(s): LIPASE, AMYLASE in the last 168 hours. No results for input(s): AMMONIA in the last 168 hours. Coagulation Profile: No results for input(s): INR, PROTIME in the last 168 hours. Cardiac Enzymes: No results for input(s): CKTOTAL, CKMB, CKMBINDEX, TROPONINI in the last 168 hours. BNP (last 3 results) No results for input(s): PROBNP in the last 8760 hours. HbA1C: Recent Labs    08/23/20 1646  HGBA1C 13.7*   CBG: Recent Labs  Lab 08/23/20 1414 08/23/20 1718 08/23/20 2034 08/24/20 0339 08/24/20 0727  GLUCAP 323* 283* 428* 281* 255*   Lipid Profile: No results for input(s): CHOL, HDL, LDLCALC, TRIG, CHOLHDL,  LDLDIRECT in the last 72 hours. Thyroid Function Tests: No results for input(s): TSH, T4TOTAL, FREET4, T3FREE, THYROIDAB in the last 72 hours. Anemia Panel: No results for input(s): VITAMINB12, FOLATE, FERRITIN, TIBC, IRON, RETICCTPCT in the last 72 hours. Sepsis Labs: Recent Labs  Lab 08/23/20 1646  PROCALCITON <0.10    Recent Results (from the past 240 hour(s))  Respiratory Panel by RT PCR (Flu A&B, Covid) - Nasopharyngeal Swab     Status: None   Collection Time: 08/23/20 11:07 AM   Specimen: Nasopharyngeal Swab  Result Value Ref Range Status   SARS Coronavirus 2 by RT PCR NEGATIVE NEGATIVE Final    Comment: (NOTE)  SARS-CoV-2 target nucleic acids are NOT DETECTED.  The SARS-CoV-2 RNA is generally detectable in upper respiratoy specimens during the acute phase of infection. The lowest concentration of SARS-CoV-2 viral copies this assay can detect is 131 copies/mL. A negative result does not preclude SARS-Cov-2 infection and should not be used as the sole basis for treatment or other patient management decisions. A negative result may occur with  improper specimen collection/handling, submission of specimen other than nasopharyngeal swab, presence of viral mutation(s) within the areas targeted by this assay, and inadequate number of viral copies (<131 copies/mL). A negative result must be combined with clinical observations, patient history, and epidemiological information. The expected result is Negative.  Fact Sheet for Patients:  PinkCheek.be  Fact Sheet for Healthcare Providers:  GravelBags.it  This test is no t yet approved or cleared by the [place] FDA and  has been authorized for detection and/or diagnosis of SARS-CoV-2 by FDA under an Emergency Use Authorization (EUA). This EUA will remain  in effect (meaning this test can be used) for the duration of the COVID-19 declaration under Section 564(b)(1) of  the Act, 21 U.S.C. section 360bbb-3(b)(1), unless the authorization is terminated or revoked sooner.     Influenza A by PCR NEGATIVE NEGATIVE Final   Influenza B by PCR NEGATIVE NEGATIVE Final    Comment: (NOTE) The Xpert Xpress SARS-CoV-2/FLU/RSV assay is intended as an aid in  the diagnosis of influenza from Nasopharyngeal swab specimens and  should not be used as a sole basis for treatment. Nasal washings and  aspirates are unacceptable for Xpert Xpress SARS-CoV-2/FLU/RSV  testing.  Fact Sheet for Patients: PinkCheek.be  Fact Sheet for Healthcare Providers: GravelBags.it  This test is not yet approved or cleared by the [place] FDA and  has been authorized for detection and/or diagnosis of SARS-CoV-2 by  FDA under an Emergency Use Authorization (EUA). This EUA will remain  in effect (meaning this test can be used) for the duration of the  Covid-19 declaration under Section 564(b)(1) of the Act, 21  U.S.C. section 360bbb-3(b)(1), unless the authorization is  terminated or revoked. Performed at [facility], [place] [address], [place], [place] [address]          Radiology Studies: DG Chest Portable 1 View  Result Date: 08/23/2020 CLINICAL DATA:  Productive cough. EXAM: PORTABLE CHEST 1 VIEW COMPARISON:  06/14/2009. FINDINGS: Mediastinum and hilar structures normal. Heart size normal. Low lung volumes. Mild right base infiltrate cannot be excluded. Stable elevation right hemidiaphragm. No pleural effusion or pneumothorax. IMPRESSION: Low lung volumes. Mild right base infiltrate cannot be excluded. Stable elevation right hemidiaphragm. Electronically Signed   By: Marcello Moores  Register   On: 08/23/2020 12:38        Scheduled Meds: . budesonide (PULMICORT) nebulizer solution  2 mg Nebulization BID  . enoxaparin (LOVENOX) injection  60 mg Subcutaneous Q24H  . insulin aspart  0-9 Units Subcutaneous  TID WC  . insulin aspart protamine- aspart  20 Units Subcutaneous BID WC  . insulin starter kit- pen needles  1 kit Other Once  . ipratropium-albuterol  3 mL Nebulization TID  . living well with diabetes book   Does not apply Once  . metFORMIN  500 mg Oral BID WC  . polyethylene glycol  17 g Oral Daily   Continuous Infusions: . azithromycin    . cefTRIAXone (ROCEPHIN)  IV 200 mL/hr at 08/23/20 1848     LOS: 1 day    Time spent: 35 minutes  Barb Merino, MD [facility] Pager [phone]

## 2020-08-24 NOTE — Progress Notes (Addendum)
Inpatient Diabetes Program Recommendations  AACE/ADA: New Consensus Statement on Inpatient Glycemic Control  Target Ranges:  Prepandial:   less than 140 mg/dL      [place] postprandial:   less than 180 mg/dL (1-2 hours)      Critically ill patients:  140 - 180 mg/dL   Results for JANESE, RADABAUGH (MRN [id number]) as of 08/24/2020 08:07  Ref. Range 08/23/2020 11:01 08/23/2020 12:04 08/23/2020 13:03 08/23/2020 14:14 08/23/2020 17:18 08/23/2020 20:34 08/24/2020 03:39 08/24/2020 07:27  Glucose-Capillary Latest Ref Range: 70 - 99 mg/dL 564 (HH) 496 (H) 475 (H) 323 (H) 283 (H) 428 (H) 281 (H) 255 (H)  Results for NADIAH, CORBIT (MRN [id number]) as of 08/24/2020 08:07  Ref. Range 08/23/2020 16:46  Hemoglobin A1C Latest Ref Range: 4.8 - 5.6 % 13.7 (H)   Review of Glycemic Control  Diabetes history: DM2 Outpatient Diabetes medications: None; Has taken Metformin, Lantus, and Novolog in the past per chart notes Current orders for Inpatient glycemic control: 70/30 20 units BID, Novolog 0-9 units TID with meals, Novolog 3 units TID with meals  Inpatient Diabetes Program Recommendations:    Insulin: Noted Lantus discontinued and 70/30 insulin ordered today. Please discontinue Novolog 3 units TID meal coverage as 70/30 is intended to provide meal coverage with 30% of regular insulin.  HbgA1C:  A1C 13.7% on 08/23/20 indicating an average glucose of 346 mg/dl over the past 2-3 months.  NOTE: Noted consult for Diabetes Coordinator. Diabetes Coordinator is not on campus over the weekend but available by pager from 8am to 5pm for questions or concerns. Chart reviewed. Noted in chart that patient has DM2 hx and she use to take Lantus and Novolog in the past as well as Metformin. Per H&P, patient has not been taking any DM medications recently. Patient has no insurance or PCP; TOC is already consulted.  Ordered Living Well with Diabetes book, insulin starter kit, RD consult for diet education. Spoke with  patient over the phone about diabetes. Patient reports that she was dx with DM in middle school around the age of 28 years old. Patient states she lives with her sister and she has been back in [place] for about 2 years.  Patient confirms that she has taken Metformin, Lantus and Novolog in the past for DM. Patient states she has not taken any DM medication in several years.  Patient reports that when she took insulin in the past she used insulin pens. Asked several questions about DM and patient not able to provide correct answers. Therefore, informed patient we would discuss basics of DM and self-management to ensure she has a good understanding of DM and importance of keeping it well controlled. Discussed basic pathophysiology of DM Type 2, basic home care, importance of checking CBGs and maintaining good CBG control to prevent long-term and short-term complications. Reviewed glucose and A1C goals.  Reviewed signs and symptoms of hyperglycemia and hypoglycemia along with treatment for both. Discussed impact of nutrition, exercise, stress, sickness, and medications on diabetes control. Informed patient that a Living Well with diabetes booklet would be ordered and encouraged patient to read through entire book to increase knowledge about DM.  Discussed A1C results (13.7% on 08/23/20) and explained what an A1C is and informed patient that current A1C indicates an average glucose of 346 mg/dl over the past 2-3 months.   Explained how hyperglycemia leads to damage within blood vessels which lead to the common complications seen with uncontrolled diabetes. Stressed to the patient the importance of  improving glycemic control to prevent further complications from uncontrolled diabetes. Patient reports that she has a glucometer and testing supplies at home that her aunt gave her. Encouraged patient to check glucose 4 times per day (before meals and at bedtime) and to keep a log book of glucose readings and DM medication  taken which patient will need to take to doctor appointments. Explained how the doctor can use the log book to continue to make adjustments with DM medications if needed.  Explained that she will likely need to resume taking insulin as an outpatient and patient is agreeable to take insulin again. Discussed insulin injections and asked patient to verbalize steps of using an insulin pen (used pens in past) but she could not remember the steps. Verbally reviewed the steps of using an insulin pen. Informed patient that it would be requested that RNs review how to use an insulin pen with the insulin pen teaching station and allow her to self administer insulin while inpatient.  Stressed to patient that she will need to check CBGs, take DM medication consistently, and follow up with providers routinely in order to get DM under control.  Patient verbalized understanding of information discussed and she states that she has no further questions at this time related to diabetes.   RNs to provide ongoing basic DM education at bedside with this patient and engage patient to actively check blood glucose and administer insulin injections. At time of discharge, please provide Rx for Novolin 70/30 Flexpens (#[id number]) and insulin pen needles [phone]).  Thanks, Barnie Alderman, RN, MSN, CDE Diabetes Coordinator Inpatient Diabetes Program [phone] (Team Pager)

## 2020-08-24 NOTE — Progress Notes (Signed)
Discussed Living With Diabetes Book with pt.

## 2020-08-24 NOTE — Progress Notes (Signed)
Please note that educating this pt has been slow. Pt insists that her "sister will take care of her and her insulin". Have shown pt how to give injection and insulin pens. She shows very little interest in this nor her diet restrictions. Educating continues, as well as encouragement for self care.

## 2020-08-24 NOTE — Progress Notes (Signed)
Incentive Spirometer given and explained to pt.

## 2020-08-25 LAB — CBC WITH DIFFERENTIAL/PLATELET
Abs Immature Granulocytes: 0.05 10*3/uL (ref 0.00–0.07)
Basophils Absolute: 0.1 10*3/uL (ref 0.0–0.1)
Basophils Relative: 1 %
Eosinophils Absolute: 0.6 10*3/uL — ABNORMAL HIGH (ref 0.0–0.5)
Eosinophils Relative: 4 %
HCT: 39.7 % (ref 36.0–46.0)
Hemoglobin: 12.3 g/dL (ref 12.0–15.0)
Immature Granulocytes: 0 %
Lymphocytes Relative: 32 %
Lymphs Abs: 4 10*3/uL (ref 0.7–4.0)
MCH: 28 pg (ref 26.0–34.0)
MCHC: 31 g/dL (ref 30.0–36.0)
MCV: 90.4 fL (ref 80.0–100.0)
Monocytes Absolute: 0.7 10*3/uL (ref 0.1–1.0)
Monocytes Relative: 6 %
Neutro Abs: 7.3 10*3/uL (ref 1.7–7.7)
Neutrophils Relative %: 57 %
Platelets: 410 10*3/uL — ABNORMAL HIGH (ref 150–400)
RBC: 4.39 MIL/uL (ref 3.87–5.11)
RDW: 13.1 % (ref 11.5–15.5)
WBC: 12.7 10*3/uL — ABNORMAL HIGH (ref 4.0–10.5)
nRBC: 0 % (ref 0.0–0.2)

## 2020-08-25 LAB — URINALYSIS, ROUTINE W REFLEX MICROSCOPIC
Bilirubin Urine: NEGATIVE
Glucose, UA: >=500 mg/dL — AB
Hgb urine dipstick: NEGATIVE
Ketones, ur: 5 mg/dL — AB
Nitrite: NEGATIVE
Protein, ur: NEGATIVE mg/dL
Specific Gravity, Urine: 1.041 — ABNORMAL HIGH (ref 1.005–1.030)
pH: 5 (ref 5.0–8.0)

## 2020-08-25 LAB — COMPREHENSIVE METABOLIC PANEL
ALT: 16 U/L (ref 0–44)
AST: 17 U/L (ref 15–41)
Albumin: 3.4 g/dL — ABNORMAL LOW (ref 3.5–5.0)
Alkaline Phosphatase: 86 U/L (ref 38–126)
Anion gap: 13 (ref 5–15)
BUN: 13 mg/dL (ref 6–20)
CO2: 23 mmol/L (ref 22–32)
Calcium: 8.5 mg/dL — ABNORMAL LOW (ref 8.9–10.3)
Chloride: 100 mmol/L (ref 98–111)
Creatinine, Ser: 0.58 mg/dL (ref 0.44–1.00)
GFR, Estimated: >60 mL/min (ref >60)
Glucose, Bld: 278 mg/dL — ABNORMAL HIGH (ref 70–99)
Potassium: 3.6 mmol/L (ref 3.5–5.1)
Sodium: 136 mmol/L (ref 135–145)
Total Bilirubin: 0.8 mg/dL (ref 0.3–1.2)
Total Protein: 7.7 g/dL (ref 6.5–8.1)

## 2020-08-25 LAB — GLUCOSE, CAPILLARY
Glucose-Capillary: 246 mg/dL — ABNORMAL HIGH (ref 70–99)
Glucose-Capillary: 222 mg/dL — ABNORMAL HIGH (ref 70–99)
Glucose-Capillary: 325 mg/dL — ABNORMAL HIGH (ref 70–99)
Glucose-Capillary: 209 mg/dL — ABNORMAL HIGH (ref 70–99)

## 2020-08-25 LAB — MAGNESIUM: Magnesium: 1.8 mg/dL (ref 1.7–2.4)

## 2020-08-25 LAB — PREGNANCY, URINE: Preg Test, Ur: NEGATIVE

## 2020-08-25 LAB — PHOSPHORUS: Phosphorus: 3 mg/dL (ref 2.5–4.6)

## 2020-08-25 MED ORDER — MAGNESIUM OXIDE 400 (241.3 MG) MG PO TABS
400.0000 mg | ORAL_TABLET | Freq: Two times a day (BID) | ORAL | Status: DC
  Administered 2020-08-25 – 2020-08-26 (×3): 400 mg via ORAL
  Filled 2020-08-25 (×3): qty 1

## 2020-08-25 MED ORDER — POTASSIUM CHLORIDE CRYS ER 20 MEQ PO TBCR
20.0000 meq | EXTENDED_RELEASE_TABLET | Freq: Two times a day (BID) | ORAL | Status: DC
  Administered 2020-08-25 – 2020-08-26 (×3): 20 meq via ORAL
  Filled 2020-08-25 (×3): qty 1

## 2020-08-25 MED ORDER — INSULIN ASPART PROT & ASPART (70-30 MIX) 100 UNIT/ML ~~LOC~~ SUSP
30.0000 [IU] | Freq: Two times a day (BID) | SUBCUTANEOUS | Status: DC
  Administered 2020-08-25 – 2020-08-26 (×3): 30 [IU] via SUBCUTANEOUS

## 2020-08-25 MED ORDER — AZITHROMYCIN 250 MG PO TABS
500.0000 mg | ORAL_TABLET | Freq: Every day | ORAL | Status: DC
  Administered 2020-08-26: 500 mg via ORAL
  Filled 2020-08-25: qty 2

## 2020-08-25 NOTE — Plan of Care (Signed)
Nutrition Education Note  RD consulted for nutrition education regarding diabetes.  Lab Results  Component Value Date   HGBA1C 13.7 (H) 08/23/2020   RD working remotely. Spoke with pt via phone this morning, she reports feeling better today. Endorses good appetite, recalls eating having an omelette with toms and cheese, sausage, toast with jam, pears, and juice for breakfast. Pt usually eats a bowl of cinnamon toast crunch with 2% milk for breakfast, cheese fries for lunch, [place] burger with light mayo, lettuce, tom, mustard on whole wheat bread for dinner. She likes to snack on potato chips, recalls 2-3 bottles of water/day, and typically naps after each meal.  RD provided "Heart Healthy, Consistent Carbohydrate Nutrition Therapy" handout from the Academy of Nutrition and Dietetics. Handout has been attached to d/c instructions for pt review. Reviewed patient's dietary recall. Provided examples on ways to decrease sodium intake in diet. Discouraged intake of processed foods and use of salt shaker. Encouraged fresh fruits and vegetables as well as whole grain sources of carbohydrates to maximize fiber intake.   RD explained A1c  and the effects of high blood sugars on the body and stressed why it is important for patient to adhere to diet recommendations. Discussed different food groups and their effects on blood sugar, emphasizing carbohydrate-containing foods. Provided list of carbohydrates and recommended serving sizes of common foods. Discussed importance of controlled and consistent carbohydrate intake throughout the day. Encouraged pt to be mindful of meals during admission as these are examples balanced meals/snacks and recommended viewing the My Plate Method on American Diabetes Association web-site. Encouraged intake of high-fiber, whole grain complex carbohydrates. Teach back method used.  Expect good compliance.  Body mass index is 52.53 kg/m. Pt meets criteria for morbid obesity based  on current BMI.  Current diet order is CM, patient is consuming approximately 100% of meals at this time. Labs and medications reviewed. No further nutrition interventions warranted at this time. RD contact information provided. If additional nutrition issues arise, please re-consult RD.   Lars Masson, RD, LDN Clinical Nutrition After Hours/Weekend Pager # in Amion

## 2020-08-25 NOTE — Progress Notes (Signed)
PHARMACIST - PHYSICIAN COMMUNICATION  CONCERNING: Antibiotic IV to Oral Route Change Policy  RECOMMENDATION: This patient is receiving azithromycin by the intravenous route.  Based on criteria approved by the Pharmacy and Therapeutics Committee, the antibiotic(s) is/are being converted to the equivalent oral dose form(s).   DESCRIPTION: These criteria include:  Patient being treated for a respiratory tract infection, urinary tract infection, cellulitis or clostridium difficile associated diarrhea if on metronidazole  The patient is not neutropenic and does not exhibit a GI malabsorption state  The patient is eating (either orally or via tube) and/or has been taking other orally administered medications for a least 24 hours  The patient is improving clinically and has a Tmax < 100.5  If you have questions about this conversion, please contact the Pharmacy Department  []  ( [phone] )  [place] []  ( [phone] )  [facility] []  ( [phone] )  [place] []  ( [phone] )  [facility] [x]  ( [phone] )  [facility]  

## 2020-08-25 NOTE — Discharge Instructions (Signed)

## 2020-08-25 NOTE — Progress Notes (Signed)
PROGRESS NOTE    IYANI DRESNER  VFI:[id number] DOB: 1991-05-27 DOA: 08/23/2020 PCP: Patient, No Pcp Per    Brief Narrative:  29 year old female with history of developmental delay, diabetes mellitus unknown type, childhood asthma and obesity presented with very high blood sugars and shortness of breath for 2 days.  She also had some dry cough.  She is not vaccinated against COVID-19. Patient has last taken any insulin in 2014, was taking Metformin since then but not taken for the last 1 year.  Does not have a maintenance health care.  Lives with her sister. On initial evaluation in ER, afebrile.  Tachycardic.  Tachypneic.  Hypoxic on room air 80% requiring 2 L oxygen.  COVID-19 negative.  Chest x-ray with low lung volumes.  Blood sugars more than 500 with no anion gap.   Assessment & Plan:   Principal Problem:   Asthma exacerbation Active Problems:   Acute respiratory failure with hypoxia (HCC)   Hyperglycemia due to diabetes mellitus (HCC)   Pseudohyponatremia   Morbid obesity with BMI of 50.0-59.9, adult (HCC)  Acute asthma exacerbation with history of mild intermittent asthma: Not on any maintenance treatment at home. Started on bronchodilator and steroid inhaler. Continue chest physiotherapy, incentive spirometry, cough medications and deep breathing exercises.  Mobilize.   Chest x-ray with possible right middle lobe pneumonia, treating with Rocephin azithromycin. Very strongly encouraged to stop vaping smoke.  Type 2 diabetes with hyperglycemia: Uncontrolled and untreated. Hemoglobin A1c 13.7.  Blood sugars at home more than 400.  Untreated.  No maintenance health care. Start on insulin 70/30, 20 units twice daily.  Blood sugars is still more than 300.  Increase to 30 units twice daily.  Started on Metformin.  Will monitor in the hospital, adjust her insulin doses before making discharge regimen. Insulin teaching, counseling and advice.  Monitoring.  Referred to PCP.  Will  refer to transitional care clinic. Will discharge patient on generic insulin 70/30 and Metformin.  Morbid obesity: BMI more than 50.  We will continue to discuss about diet exercise and lifestyle modification.  Challenging counseling given patient's cognitive status.   DVT prophylaxis: Lovenox subcu   Code Status: Full code Family Communication: Patient's sister on the phone Disposition Plan: Status is: Inpatient  Remains inpatient appropriate because:Inpatient level of care appropriate due to severity of illness   Dispo: The patient is from: Home              Anticipated d/c is to: Home              Anticipated d/c date is: Advertising account executive.              Patient currently is not medically stable to d/c.         Consultants:   None  Procedures:   None  Antimicrobials:  Antibiotics Given (last 72 hours)    Date/Time Action Medication Dose Rate   08/23/20 1339 New Bag/Given   cefTRIAXone (ROCEPHIN) 1 g in sodium chloride 0.9 % 100 mL IVPB 1 g 200 mL/hr   08/23/20 1419 New Bag/Given   azithromycin (ZITHROMAX) 500 mg in sodium chloride 0.9 % 250 mL IVPB 500 mg 250 mL/hr   08/23/20 1811 New Bag/Given   cefTRIAXone (ROCEPHIN) 1 g in sodium chloride 0.9 % 100 mL IVPB 1 g 200 mL/hr   08/24/20 1307 New Bag/Given   azithromycin (ZITHROMAX) 500 mg in sodium chloride 0.9 % 250 mL IVPB 500 mg 250 mL/hr   08/24/20 1709  New Bag/Given   cefTRIAXone (ROCEPHIN) 1 g in sodium chloride 0.9 % 100 mL IVPB 1 g 200 mL/hr         Subjective: Patient seen and examined.  Sister-in-law sleeping in the room.  Patient herself is poor historian.  Denies any complaints.  Objective: Vitals:   08/24/20 1835 08/24/20 2035 08/25/20 0523 08/25/20 0805  BP:  120/79 119/74   Pulse:  (!) 106 98   Resp:  20 16   Temp:  97.7 F (36.5 C) 98 F (36.7 C)   TempSrc:  Oral Oral   SpO2: 94% 100% 97% 94%  Weight:      Height:        Intake/Output Summary (Last 24 hours) at 08/25/2020 1304 Last data  filed at 08/25/2020 0852 Gross per 24 hour  Intake 240 ml  Output --  Net 240 ml   Filed Weights   08/23/20 1057  Weight: 122 kg    Examination:  General exam: Appears calm and comfortable  On room air. Respiratory system: Bilateral expiratory wheezes. Cardiovascular system: S1 & S2 heard, RRR. No JVD, murmurs, rubs, gallops or clicks. No pedal edema. Gastrointestinal system: Abdomen is nondistended, soft and nontender. No organomegaly or masses felt. Normal bowel sounds heard.  Obese and pendulous. Central nervous system: Alert and oriented. No focal neurological deficits. Extremities: Symmetric 5 x 5 power. Skin: No rashes, lesions or ulcers Psychiatry: Judgement and insight appear normal. Mood & affect flat.    Data Reviewed: I have personally reviewed following labs and imaging studies  CBC: Recent Labs  Lab 08/23/20 1106 08/23/20 1637 08/24/20 0355 08/25/20 0508  WBC 14.9* 14.8* 13.7* 12.7*  NEUTROABS  --   --   --  7.3  HGB 13.6 13.0 13.5 12.3  HCT 41.8 38.8 41.3 39.7  MCV 84.6 83.6 85.7 90.4  PLT 412* 392 414* 410*   Basic Metabolic Panel: Recent Labs  Lab 08/23/20 1106 08/23/20 1637 08/24/20 0355 08/25/20 0508  NA 133*  --  137 136  K 4.3  --  4.5 3.6  CL 92*  --  99 100  CO2 24  --  23 23  GLUCOSE 580*  --  290* 278*  BUN 7  --  11 13  CREATININE 0.77 0.54 0.58 0.58  CALCIUM 9.8  --  9.4 8.5*  MG  --  1.7  --  1.8  PHOS  --   --   --  3.0   GFR: Estimated Creatinine Clearance: 126.9 mL/min (by C-G formula based on SCr of 0.58 mg/dL). Liver Function Tests: Recent Labs  Lab 08/25/20 0508  AST 17  ALT 16  ALKPHOS 86  BILITOT 0.8  PROT 7.7  ALBUMIN 3.4*   No results for input(s): LIPASE, AMYLASE in the last 168 hours. No results for input(s): AMMONIA in the last 168 hours. Coagulation Profile: No results for input(s): INR, PROTIME in the last 168 hours. Cardiac Enzymes: No results for input(s): CKTOTAL, CKMB, CKMBINDEX, TROPONINI in  the last 168 hours. BNP (last 3 results) No results for input(s): PROBNP in the last 8760 hours. HbA1C: Recent Labs    08/23/20 1646  HGBA1C 13.7*   CBG: Recent Labs  Lab 08/24/20 1110 08/24/20 1644 08/24/20 2036 08/25/20 0850 08/25/20 1211  GLUCAP 341* 394* 369* 246* 222*   Lipid Profile: No results for input(s): CHOL, HDL, LDLCALC, TRIG, CHOLHDL, LDLDIRECT in the last 72 hours. Thyroid Function Tests: No results for input(s): TSH, T4TOTAL, FREET4, T3FREE, THYROIDAB in  the last 72 hours. Anemia Panel: No results for input(s): VITAMINB12, FOLATE, FERRITIN, TIBC, IRON, RETICCTPCT in the last 72 hours. Sepsis Labs: Recent Labs  Lab 08/23/20 1646  PROCALCITON <0.10    Recent Results (from the past 240 hour(s))  Respiratory Panel by RT PCR (Flu A&B, Covid) - Nasopharyngeal Swab     Status: None   Collection Time: 08/23/20 11:07 AM   Specimen: Nasopharyngeal Swab  Result Value Ref Range Status   SARS Coronavirus 2 by RT PCR NEGATIVE NEGATIVE Final    Comment: (NOTE) SARS-CoV-2 target nucleic acids are NOT DETECTED.  The SARS-CoV-2 RNA is generally detectable in upper respiratoy specimens during the acute phase of infection. The lowest concentration of SARS-CoV-2 viral copies this assay can detect is 131 copies/mL. A negative result does not preclude SARS-Cov-2 infection and should not be used as the sole basis for treatment or other patient management decisions. A negative result may occur with  improper specimen collection/handling, submission of specimen other than nasopharyngeal swab, presence of viral mutation(s) within the areas targeted by this assay, and inadequate number of viral copies (<131 copies/mL). A negative result must be combined with clinical observations, patient history, and epidemiological information. The expected result is Negative.  Fact Sheet for Patients:  https://www.moore.com/  Fact Sheet for Healthcare Providers:   https://www.young.biz/  This test is no t yet approved or cleared by the [place] FDA and  has been authorized for detection and/or diagnosis of SARS-CoV-2 by FDA under an Emergency Use Authorization (EUA). This EUA will remain  in effect (meaning this test can be used) for the duration of the COVID-19 declaration under Section 564(b)(1) of the Act, 21 U.S.C. section 360bbb-3(b)(1), unless the authorization is terminated or revoked sooner.     Influenza A by PCR NEGATIVE NEGATIVE Final   Influenza B by PCR NEGATIVE NEGATIVE Final    Comment: (NOTE) The Xpert Xpress SARS-CoV-2/FLU/RSV assay is intended as an aid in  the diagnosis of influenza from Nasopharyngeal swab specimens and  should not be used as a sole basis for treatment. Nasal washings and  aspirates are unacceptable for Xpert Xpress SARS-CoV-2/FLU/RSV  testing.  Fact Sheet for Patients: https://www.moore.com/  Fact Sheet for Healthcare Providers: https://www.young.biz/  This test is not yet approved or cleared by the [place] FDA and  has been authorized for detection and/or diagnosis of SARS-CoV-2 by  FDA under an Emergency Use Authorization (EUA). This EUA will remain  in effect (meaning this test can be used) for the duration of the  Covid-19 declaration under Section 564(b)(1) of the Act, 21  U.S.C. section 360bbb-3(b)(1), unless the authorization is  terminated or revoked. Performed at [facility], [address] [place], [place] [address]          Radiology Studies: No results found.      Scheduled Meds: . budesonide (PULMICORT) nebulizer solution  2 mg Nebulization BID  . enoxaparin (LOVENOX) injection  60 mg Subcutaneous Q24H  . insulin aspart  0-9 Units Subcutaneous TID WC  . insulin aspart protamine- aspart  30 Units Subcutaneous BID WC  . ipratropium-albuterol  3 mL Nebulization TID  . magnesium oxide   400 mg Oral BID  . metFORMIN  500 mg Oral BID WC  . polyethylene glycol  17 g Oral Daily  . potassium chloride  20 mEq Oral BID   Continuous Infusions: . azithromycin Stopped (08/24/20 1605)  . cefTRIAXone (ROCEPHIN)  IV 1 g (08/24/20 1709)  LOS: 2 days    Time spent: 30 minutes    Barb Merino, MD [facility] Pager [phone]

## 2020-08-25 NOTE — Progress Notes (Signed)
Have called Lab regarding UA, and pregnancy test add-on, collected on 08/23/20. Lab stating they never received the urine. Will reorder and send now.

## 2020-08-26 ENCOUNTER — Ambulatory Visit: Payer: Self-pay

## 2020-08-26 ENCOUNTER — Inpatient Hospital Stay: Payer: Self-pay

## 2020-08-26 DIAGNOSIS — J4521 Mild intermittent asthma with (acute) exacerbation: Principal | ICD-10-CM

## 2020-08-26 LAB — COMPREHENSIVE METABOLIC PANEL
ALT: 13 U/L (ref 0–44)
AST: 12 U/L — ABNORMAL LOW (ref 15–41)
Albumin: 3 g/dL — ABNORMAL LOW (ref 3.5–5.0)
Alkaline Phosphatase: 65 U/L (ref 38–126)
Anion gap: 8 (ref 5–15)
BUN: 13 mg/dL (ref 6–20)
CO2: 24 mmol/L (ref 22–32)
Calcium: 8.4 mg/dL — ABNORMAL LOW (ref 8.9–10.3)
Chloride: 104 mmol/L (ref 98–111)
Creatinine, Ser: 0.48 mg/dL (ref 0.44–1.00)
GFR, Estimated: >60 mL/min (ref >60)
Glucose, Bld: 164 mg/dL — ABNORMAL HIGH (ref 70–99)
Potassium: 3.9 mmol/L (ref 3.5–5.1)
Sodium: 136 mmol/L (ref 135–145)
Total Bilirubin: 0.7 mg/dL (ref 0.3–1.2)
Total Protein: 6.4 g/dL — ABNORMAL LOW (ref 6.5–8.1)

## 2020-08-26 LAB — GLUCOSE, CAPILLARY
Glucose-Capillary: 169 mg/dL — ABNORMAL HIGH (ref 70–99)
Glucose-Capillary: 202 mg/dL — ABNORMAL HIGH (ref 70–99)

## 2020-08-26 MED ORDER — NOVOLIN 70/30 FLEXPEN (70-30) 100 UNIT/ML ~~LOC~~ SUPN: 30.0000 [IU] | PEN_INJECTOR | Freq: Two times a day (BID) | SUBCUTANEOUS | 11 refills | Status: AC

## 2020-08-26 MED ORDER — NOVOLIN 70/30 FLEXPEN (70-30) 100 UNIT/ML ~~LOC~~ SUPN: 30.0000 [IU] | PEN_INJECTOR | Freq: Two times a day (BID) | SUBCUTANEOUS | 11 refills | Status: DC

## 2020-08-26 MED ORDER — METFORMIN HCL 500 MG PO TABS
500.0000 mg | ORAL_TABLET | Freq: Two times a day (BID) | ORAL | 1 refills | Status: AC
Start: 2020-08-26 — End: ?

## 2020-08-26 MED ORDER — BUDESONIDE 90 MCG/ACT IN AEPB: 1.0000 | INHALATION_SPRAY | Freq: Two times a day (BID) | RESPIRATORY_TRACT | 2 refills | Status: AC

## 2020-08-26 MED ORDER — BUDESONIDE 90 MCG/ACT IN AEPB: 1.0000 | INHALATION_SPRAY | Freq: Two times a day (BID) | RESPIRATORY_TRACT | 2 refills | Status: DC

## 2020-08-26 MED ORDER — INSULIN PEN NEEDLE 32G X 4 MM MISC: 1.0000 | Freq: Two times a day (BID) | 1 refills | Status: DC

## 2020-08-26 MED ORDER — INSULIN PEN NEEDLE 32G X 4 MM MISC: 1.0000 | Freq: Two times a day (BID) | 1 refills | Status: AC

## 2020-08-26 MED ORDER — ALBUTEROL SULFATE HFA 108 (90 BASE) MCG/ACT IN AERS: 2.0000 | INHALATION_SPRAY | Freq: Four times a day (QID) | RESPIRATORY_TRACT | 2 refills | Status: DC | PRN

## 2020-08-26 MED ORDER — METFORMIN HCL 500 MG PO TABS: 500.0000 mg | ORAL_TABLET | Freq: Two times a day (BID) | ORAL | 1 refills | Status: DC

## 2020-08-26 MED ORDER — BLOOD GLUCOSE MONITOR KIT: PACK | 0 refills | Status: DC

## 2020-08-26 MED ORDER — BLOOD GLUCOSE MONITOR KIT: PACK | 0 refills | Status: AC

## 2020-08-26 MED ORDER — ALBUTEROL SULFATE HFA 108 (90 BASE) MCG/ACT IN AERS: 2.0000 | INHALATION_SPRAY | Freq: Four times a day (QID) | RESPIRATORY_TRACT | 2 refills | Status: AC | PRN

## 2020-08-26 NOTE — TOC Transition Note (Signed)
Transition of Care [facility]) - CM/SW Discharge Note   Patient Details  Name: Gloria Beasley MRN: [id number] Date of Birth: Dec 07, 1990  [facility] CM/SW Contact:  Ida Rogue, LCSW Phone Number: 08/26/2020, 10:04 AM   Clinical Narrative:  Patient who is scheduled for d/c today is in need of hospital follow up appointment with PCP, medications.  She has been scheduled with  and Wellness clinic, and can pick up her medications and medical needs there today at the pharmacy.  No further needs identifed. TOC sign off.    Final next level of care: Home/Self Care Barriers to Discharge: No Barriers Identified   Patient Goals and CMS Choice        Discharge Placement                       Discharge Plan and Services                                     Social Determinants of Health (SDOH) Interventions     Readmission Risk Interventions No flowsheet data found.

## 2020-08-26 NOTE — Discharge Summary (Signed)
Physician Discharge Summary  Gloria Beasley OZD:[id number] DOB: 1991/05/18 DOA: 08/23/2020  PCP: Patient, No Pcp Per  Admit date: 08/23/2020 Discharge date: 08/26/2020  Admitted From: Home Disposition: Home  Recommendations for Outpatient Follow-up:  1. Follow up with PCP as a scheduled on 11/11 2. Keep a logbook of blood sugars at home and bring it to doctor's office follow-up. 3. We will schedule first dose of COVID-19 vaccine today in the hospital, he will need further doses as planned.  Home Health: Not applicable Equipment/Devices: Not applicable  Discharge Condition: Stable CODE STATUS: Full code Diet recommendation: Low-salt and low-carb diet  Discharge summary: 29 year old female with history of developmental delay, diabetes mellitus unknown type, childhood asthma and obesity presented with very high blood sugars and shortness of breath for 2 days.  She also had some dry cough.  She is not vaccinated against COVID-19. Patient has last taken any insulin in 2014, was taking Metformin since then but not taken for the last 1 year.  Does not have a maintenance health care.  Lives with her sister. On initial evaluation in ER, afebrile.  Tachycardic.  Tachypneic.  Hypoxic on room air 80% requiring 2 L oxygen.  COVID-19 negative.  Chest x-ray with low lung volumes.  Blood sugars more than 500 with no anion gap.  Acute asthma exacerbation with history of mild intermittent asthma: Not on any maintenance treatment at home. Improved with bronchodilator and steroid inhalers. Currently on room air.  Also treated with 3 days of IV antibiotics. Will discharge patient with scheduled steroid inhalers and rescue albuterol inhaler. She is scheduled to follow-up at [facility] health transitional care clinic on 11/11.  She may need more help with medications and charity.  Type 2 diabetes with hyperglycemia: Uncontrolled and untreated. Hemoglobin A1c 13.7.  Blood sugars at home more than 400.   Untreated.  No maintenance health care. Insulin was uptitrated and currently on Novolin 70/30, 30 units twice a day.  Blood sugars are less than 200. Will discharge patient with 30 units twice a day insulin, Metformin 500 mg twice daily. Extensive education including education to family. Diet exercise and lifestyle modification discussed.  It will be challenging because of patient's cognitive challenges.  Morbid obesity: BMI more than 50.  We will continue to discuss about diet exercise and lifestyle modification.  Challenging counseling given patient's cognitive status  Suspected UTI: Unfortunately cultures were not done.  Patient is asymptomatic today.  She has already received 3 days of Rocephin.  No indication to treat further.  Patient is stable today.  She is able to go home.   Discharge Diagnoses:  Principal Problem:   Asthma exacerbation Active Problems:   Acute respiratory failure with hypoxia (HCC)   Hyperglycemia due to diabetes mellitus ([place])   Pseudohyponatremia   Morbid obesity with BMI of 50.0-59.9, adult [facility])    Discharge Instructions  Discharge Instructions    Diet - low sodium heart healthy   Complete by: As directed    Diet Carb Modified   Complete by: As directed    Discharge instructions   Complete by: As directed    Check your blood sugars at home 2-3 times a day, keep a logbook and bring it to doctor's visit.   Increase activity slowly   Complete by: As directed      Allergies as of 08/26/2020   No Known Allergies     Medication List    STOP taking these medications   nystatin powder Commonly known as:  MYCOSTATIN/NYSTOP     TAKE these medications   acetaminophen 500 MG tablet Commonly known as: TYLENOL Take 1,000 mg by mouth every 6 (six) hours as needed for mild pain.   albuterol 108 (90 Base) MCG/ACT inhaler Commonly known as: VENTOLIN HFA Inhale 2 puffs into the lungs every 6 (six) hours as needed for wheezing or shortness of  breath.   blood glucose meter kit and supplies Kit Dispense based on patient and insurance preference. Use up to four times daily as directed. (FOR ICD-9 250.00, 250.01).   Budesonide 90 MCG/ACT inhaler Inhale 1 puff into the lungs 2 (two) times daily.   Insulin Pen Needle 32G X 4 MM Misc 1 Syringe by Does not apply route 2 (two) times daily.   metFORMIN 500 MG tablet Commonly known as: GLUCOPHAGE Take 1 tablet (500 mg total) by mouth 2 (two) times daily with a meal.   NovoLIN 70/30 FlexPen (70-30) 100 UNIT/ML KwikPen Generic drug: insulin isophane & regular human Inject 30 Units into the skin 2 (two) times daily.       Follow-up [facility] Follow up on 09/05/2020.   Why: Thursday at 11:10 for your hospital follow up appointment.  Also, they have a pharmacy, and you can pick up your medications there today.   Contact information: [place] [id number] [phone]             No Known Allergies  Consultations:  None   Procedures/Studies: DG Chest Portable 1 View  Result Date: 08/23/2020 CLINICAL DATA:  Productive cough. EXAM: PORTABLE CHEST 1 VIEW COMPARISON:  06/14/2009. FINDINGS: Mediastinum and hilar structures normal. Heart size normal. Low lung volumes. Mild right base infiltrate cannot be excluded. Stable elevation right hemidiaphragm. No pleural effusion or pneumothorax. IMPRESSION: Low lung volumes. Mild right base infiltrate cannot be excluded. Stable elevation right hemidiaphragm. Electronically Signed   By: Marcello Moores  Register   On: 08/23/2020 12:38   (Echo, Carotid, EGD, Colonoscopy, ERCP)    Subjective: Patient seen and examined.  No overnight events.  Patient herself is poor historian.  She feels fine.  She ambulated around and did not feel any shortness of breath. Blood sugars about 200-180.   Discharge Exam: Vitals:   08/26/20 0356 08/26/20 0806  BP: 125/85   Pulse:  91   Resp: 20   Temp: 98.4 F (36.9 C)   SpO2: 96% 96%   Vitals:   08/25/20 2024 08/25/20 2103 08/26/20 0356 08/26/20 0806  BP: 134/86  125/85   Pulse: 99  91   Resp: 20  20   Temp: 97.7 F (36.5 C)  98.4 F (36.9 C)   TempSrc: Oral     SpO2: 96% 95% 96% 96%  Weight:      Height:        General: Pt is alert, awake, not in acute distress Cardiovascular: RRR, S1/S2 +, no rubs, no gallops Respiratory: CTA bilaterally, no wheezing, no rhonchi Abdominal: Soft, NT, ND, bowel sounds +, obese and pendulous. Extremities: no edema, no cyanosis    The results of significant diagnostics from this hospitalization (including imaging, microbiology, ancillary and laboratory) are listed below for reference.     Microbiology: Recent Results (from the past 240 hour(s))  Respiratory Panel by RT PCR (Flu A&B, Covid) - Nasopharyngeal Swab     Status: None   Collection Time: 08/23/20 11:07 AM   Specimen: Nasopharyngeal Swab  Result Value Ref Range Status  SARS Coronavirus 2 by RT PCR NEGATIVE NEGATIVE Final    Comment: (NOTE) SARS-CoV-2 target nucleic acids are NOT DETECTED.  The SARS-CoV-2 RNA is generally detectable in upper respiratoy specimens during the acute phase of infection. The lowest concentration of SARS-CoV-2 viral copies this assay can detect is 131 copies/mL. A negative result does not preclude SARS-Cov-2 infection and should not be used as the sole basis for treatment or other patient management decisions. A negative result may occur with  improper specimen collection/handling, submission of specimen other than nasopharyngeal swab, presence of viral mutation(s) within the areas targeted by this assay, and inadequate number of viral copies (<131 copies/mL). A negative result must be combined with clinical observations, patient history, and epidemiological information. The expected result is Negative.  Fact Sheet for Patients:   PinkCheek.be  Fact Sheet for Healthcare Providers:  GravelBags.it  This test is no t yet approved or cleared by the [place] FDA and  has been authorized for detection and/or diagnosis of SARS-CoV-2 by FDA under an Emergency Use Authorization (EUA). This EUA will remain  in effect (meaning this test can be used) for the duration of the COVID-19 declaration under Section 564(b)(1) of the Act, 21 U.S.C. section 360bbb-3(b)(1), unless the authorization is terminated or revoked sooner.     Influenza A by PCR NEGATIVE NEGATIVE Final   Influenza B by PCR NEGATIVE NEGATIVE Final    Comment: (NOTE) The Xpert Xpress SARS-CoV-2/FLU/RSV assay is intended as an aid in  the diagnosis of influenza from Nasopharyngeal swab specimens and  should not be used as a sole basis for treatment. Nasal washings and  aspirates are unacceptable for Xpert Xpress SARS-CoV-2/FLU/RSV  testing.  Fact Sheet for Patients: PinkCheek.be  Fact Sheet for Healthcare Providers: GravelBags.it  This test is not yet approved or cleared by the [place] FDA and  has been authorized for detection and/or diagnosis of SARS-CoV-2 by  FDA under an Emergency Use Authorization (EUA). This EUA will remain  in effect (meaning this test can be used) for the duration of the  Covid-19 declaration under Section 564(b)(1) of the Act, 21  U.S.C. section 360bbb-3(b)(1), unless the authorization is  terminated or revoked. Performed at [facility], [place] [address] [place], [place] [address]      Labs: BNP (last 3 results) No results for input(s): BNP in the last 8760 hours. Basic Metabolic Panel: Recent Labs  Lab 08/23/20 1106 08/23/20 1637 08/24/20 0355 08/25/20 0508 08/26/20 0329  NA 133*  --  137 136 136  K 4.3  --  4.5 3.6 3.9  CL 92*  --  99 100 104  CO2 24  --  _0 GLUCOSE 580*  --  290* 278* 164*  BUN 7  --  _1 CREATININE 0.77 0.54 0.58 0.58 0.48  CALCIUM 9.8  --  9.4 8.5* 8.4*  MG  --  1.7  --  1.8  --   PHOS  --   --   --  3.0  --    Liver Function Tests: Recent Labs  Lab 08/25/20 0508 08/26/20 0329  AST 17 12*  ALT 16 13  ALKPHOS 86 65  BILITOT 0.8 0.7  PROT 7.7 6.4*  ALBUMIN 3.4* 3.0*   No results for input(s): LIPASE, AMYLASE in the last 168 hours. No results for input(s): AMMONIA in the last 168 hours. CBC: Recent Labs  Lab 08/23/20 1106 08/23/20 1637 08/24/20 0355 08/25/20 0508  WBC  14.9* 14.8* 13.7* 12.7*  NEUTROABS  --   --   --  7.3  HGB 13.6 13.0 13.5 12.3  HCT 41.8 38.8 41.3 39.7  MCV 84.6 83.6 85.7 90.4  PLT 412* 392 414* 410*   Cardiac Enzymes: No results for input(s): CKTOTAL, CKMB, CKMBINDEX, TROPONINI in the last 168 hours. BNP: Invalid input(s): POCBNP CBG: Recent Labs  Lab 08/25/20 1211 08/25/20 1612 08/25/20 1943 08/26/20 0747 08/26/20 1140  GLUCAP 222* 325* 209* 169* 202*   D-Dimer No results for input(s): DDIMER in the last 72 hours. Hgb A1c Recent Labs    08/23/20 1646  HGBA1C 13.7*   Lipid Profile No results for input(s): CHOL, HDL, LDLCALC, TRIG, CHOLHDL, LDLDIRECT in the last 72 hours. Thyroid function studies No results for input(s): TSH, T4TOTAL, T3FREE, THYROIDAB in the last 72 hours.  Invalid input(s): FREET3 Anemia work up No results for input(s): VITAMINB12, FOLATE, FERRITIN, TIBC, IRON, RETICCTPCT in the last 72 hours. Urinalysis    Component Value Date/Time   COLORURINE YELLOW 08/25/2020 1114   APPEARANCEUR CLOUDY (A) 08/25/2020 1114   LABSPEC 1.041 (H) 08/25/2020 1114   PHURINE 5.0 08/25/2020 1114   GLUCOSEU >=500 (A) 08/25/2020 1114   HGBUR NEGATIVE 08/25/2020 1114   BILIRUBINUR NEGATIVE 08/25/2020 1114   KETONESUR 5 (A) 08/25/2020 1114   PROTEINUR NEGATIVE 08/25/2020 1114   UROBILINOGEN 1.0 09/17/2009 1700   NITRITE NEGATIVE 08/25/2020 1114    LEUKOCYTESUR LARGE (A) 08/25/2020 1114   Sepsis Labs Invalid input(s): PROCALCITONIN,  WBC,  LACTICIDVEN Microbiology Recent Results (from the past 240 hour(s))  Respiratory Panel by RT PCR (Flu A&B, Covid) - Nasopharyngeal Swab     Status: None   Collection Time: 08/23/20 11:07 AM   Specimen: Nasopharyngeal Swab  Result Value Ref Range Status   SARS Coronavirus 2 by RT PCR NEGATIVE NEGATIVE Final    Comment: (NOTE) SARS-CoV-2 target nucleic acids are NOT DETECTED.  The SARS-CoV-2 RNA is generally detectable in upper respiratoy specimens during the acute phase of infection. The lowest concentration of SARS-CoV-2 viral copies this assay can detect is 131 copies/mL. A negative result does not preclude SARS-Cov-2 infection and should not be used as the sole basis for treatment or other patient management decisions. A negative result may occur with  improper specimen collection/handling, submission of specimen other than nasopharyngeal swab, presence of viral mutation(s) within the areas targeted by this assay, and inadequate number of viral copies (<131 copies/mL). A negative result must be combined with clinical observations, patient history, and epidemiological information. The expected result is Negative.  Fact Sheet for Patients:  PinkCheek.be  Fact Sheet for Healthcare Providers:  GravelBags.it  This test is no t yet approved or cleared by the [place] FDA and  has been authorized for detection and/or diagnosis of SARS-CoV-2 by FDA under an Emergency Use Authorization (EUA). This EUA will remain  in effect (meaning this test can be used) for the duration of the COVID-19 declaration under Section 564(b)(1) of the Act, 21 U.S.C. section 360bbb-3(b)(1), unless the authorization is terminated or revoked sooner.     Influenza A by PCR NEGATIVE NEGATIVE Final   Influenza B by PCR NEGATIVE NEGATIVE Final    Comment:  (NOTE) The Xpert Xpress SARS-CoV-2/FLU/RSV assay is intended as an aid in  the diagnosis of influenza from Nasopharyngeal swab specimens and  should not be used as a sole basis for treatment. Nasal washings and  aspirates are unacceptable for Xpert Xpress SARS-CoV-2/FLU/RSV  testing.  Fact Sheet for  Patients: PinkCheek.be  Fact Sheet for Healthcare Providers: GravelBags.it  This test is not yet approved or cleared by the [place] FDA and  has been authorized for detection and/or diagnosis of SARS-CoV-2 by  FDA under an Emergency Use Authorization (EUA). This EUA will remain  in effect (meaning this test can be used) for the duration of the  Covid-19 declaration under Section 564(b)(1) of the Act, 21  U.S.C. section 360bbb-3(b)(1), unless the authorization is  terminated or revoked. Performed at [facility], [place] [address], [place], [place] [address]      Time coordinating discharge: 45 minutes  SIGNED:   Barb Merino, MD  [facility] 08/26/2020, 11:52 AM

## 2020-08-27 ENCOUNTER — Telehealth: Payer: Self-pay | Admitting: General Practice

## 2020-08-27 LAB — URINE CULTURE

## 2020-08-27 NOTE — Telephone Encounter (Signed)
Copied from CRM [id number]. Topic: General - Other >> Aug 27, 2020  2:17 PM Jaquita Rector A wrote: Reason for CRM: Gloria Beasley patient sister in law and patient called in to inquire if patient can get an Rx from Dr till [facility] kick in in about a month.  Patient was discharged from the hospital on 08/26/20 but has no insurance so was not able to get any of the medication that have been prescribed ( see medication list ) Please call Gloria Gloria Beasley Gloria Beasley phone off currently Ph# [phone]

## 2020-08-28 NOTE — Telephone Encounter (Signed)
Spoke to patient. Informed to come to [facility] to discuss payment for medications.

## 2020-09-04 NOTE — Progress Notes (Deleted)
Patient ID: Gloria Beasley, female   DOB: Feb 15, 1991, 29 y.o.   MRN: [id number]   After hospitalization 10/29-11/01 after not taking any diabetes meds for over a year.  Long-standing diagnosis of DM.    From discharge summary: Discharge summary: 29 year old female with history of developmental delay, diabetes mellitus unknown type, childhood asthma and obesity presented with very high blood sugars and shortness of breath for 2 days. She also had some dry cough. She is not vaccinated against COVID-19. Patient has last taken any insulin in 2014, was taking Metformin since then but not taken for the last 1 year. Does not have a maintenance health care. Lives with her sister. On initial evaluation in ER, afebrile. Tachycardic. Tachypneic. Hypoxic on room air 80% requiring 2 L oxygen. COVID-19 negative. Chest x-ray with low lung volumes. Blood sugars more than 500 with no anion gap.  Acute asthma exacerbation with history of mild intermittent asthma: Not on any maintenance treatment at home. Improved with bronchodilator and steroid inhalers. Currently on room air.  Also treated with 3 days of IV antibiotics. Will discharge patient with scheduled steroid inhalers and rescue albuterol inhaler. She is scheduled to follow-up at [facility] health transitional care clinic on 11/11.  She may need more help with medications and charity.  Type 2 diabetes with hyperglycemia: Uncontrolled and untreated. Hemoglobin A1c 13.7. Blood sugars at home more than 400. Untreated. No maintenance health care. Insulin was uptitrated and currently on Novolin 70/30, 30 units twice a day.  Blood sugars are less than 200. Will discharge patient with 30 units twice a day insulin, Metformin 500 mg twice daily. Extensive education including education to family. Diet exercise and lifestyle modification discussed.  It will be challenging because of patient's cognitive challenges.  Morbid obesity: BMI more than 50. We  will continue to discuss about diet exercise and lifestyle modification. Challenging counseling given patient's cognitive status  Suspected UTI: Unfortunately cultures were not done.  Patient is asymptomatic today.  She has already received 3 days of Rocephin.  No indication to treat further.  Patient is stable today.  She is able to go home.   Discharge Diagnoses:  Principal Problem:   Asthma exacerbation Active Problems:   Acute respiratory failure with hypoxia (HCC)   Hyperglycemia due to diabetes mellitus (HCC)   Pseudohyponatremia   Morbid obesity with BMI of 50.0-59.9, adult

## 2020-09-05 ENCOUNTER — Inpatient Hospital Stay: Payer: Self-pay | Admitting: Physician Assistant

## 2020-09-11 NOTE — Progress Notes (Deleted)
Patient ID: Gloria Beasley, female   DOB: Oct 22, 1991, 29 y.o.   MRN: [id number]   After hospitalization 10/29-11/1  From discharge summary: Recommendations for Outpatient Follow-up:  1. Follow up with PCP as a scheduled on 11/11 2. Keep a logbook of blood sugars at home and bring it to doctor's office follow-up. 3. We will schedule first dose of COVID-19 vaccine today in the hospital, he will need further doses as planned.  Home Health: Not applicable Equipment/Devices: Not applicable  Discharge Condition: Stable CODE STATUS: Full code Diet recommendation: Low-salt and low-carb diet  Discharge summary: 29 year old female with history of developmental delay, diabetes mellitus unknown type, childhood asthma and obesity presented with very high blood sugars and shortness of breath for 2 days. She also had some dry cough. She is not vaccinated against COVID-19. Patient has last taken any insulin in 2014, was taking Metformin since then but not taken for the last 1 year. Does not have a maintenance health care. Lives with her sister. On initial evaluation in ER, afebrile. Tachycardic. Tachypneic. Hypoxic on room air 80% requiring 2 L oxygen. COVID-19 negative. Chest x-ray with low lung volumes. Blood sugars more than 500 with no anion gap.  Acute asthma exacerbation with history of mild intermittent asthma: Not on any maintenance treatment at home. Improved with bronchodilator and steroid inhalers. Currently on room air.  Also treated with 3 days of IV antibiotics. Will discharge patient with scheduled steroid inhalers and rescue albuterol inhaler. She is scheduled to follow-up at [facility] health transitional care clinic on 11/11.  She may need more help with medications and charity.  Type 2 diabetes with hyperglycemia: Uncontrolled and untreated. Hemoglobin A1c 13.7. Blood sugars at home more than 400. Untreated. No maintenance health care. Insulin was uptitrated and  currently on Novolin 70/30, 30 units twice a day.  Blood sugars are less than 200. Will discharge patient with 30 units twice a day insulin, Metformin 500 mg twice daily. Extensive education including education to family. Diet exercise and lifestyle modification discussed.  It will be challenging because of patient's cognitive challenges.  Morbid obesity: BMI more than 50. We will continue to discuss about diet exercise and lifestyle modification. Challenging counseling given patient's cognitive status  Suspected UTI: Unfortunately cultures were not done.  Patient is asymptomatic today.  She has already received 3 days of Rocephin.  No indication to treat further.  Patient is stable today.  She is able to go home.   Discharge Diagnoses:  Principal Problem:   Asthma exacerbation Active Problems:   Acute respiratory failure with hypoxia (HCC)   Hyperglycemia due to diabetes mellitus (HCC)   Pseudohyponatremia   Morbid obesity with BMI of 50.0-59.9, adult (HCC)

## 2020-09-18 ENCOUNTER — Inpatient Hospital Stay: Payer: Self-pay | Admitting: Physician Assistant

## 2020-09-18 DIAGNOSIS — E1165 Type 2 diabetes mellitus with hyperglycemia: Secondary | ICD-10-CM
